# Patient Record
Sex: Male | Born: 1975
Health system: Southern US, Community
[De-identification: ages and names within clinical notes are randomized; demographics above are authoritative.]

## PROBLEM LIST (undated history)

## (undated) DIAGNOSIS — Z789 Other specified health status: Secondary | ICD-10-CM

## (undated) DIAGNOSIS — K219 Gastro-esophageal reflux disease without esophagitis: Secondary | ICD-10-CM

## (undated) DIAGNOSIS — Z8489 Family history of other specified conditions: Secondary | ICD-10-CM

---

## 2019-10-16 ENCOUNTER — Emergency Department (HOSPITAL_COMMUNITY): Payer: BC Managed Care – PPO

## 2019-10-16 ENCOUNTER — Inpatient Hospital Stay (HOSPITAL_COMMUNITY): Payer: BC Managed Care – PPO

## 2019-10-16 ENCOUNTER — Observation Stay (HOSPITAL_COMMUNITY): Payer: BC Managed Care – PPO | Admitting: Anesthesiology

## 2019-10-16 ENCOUNTER — Inpatient Hospital Stay (HOSPITAL_COMMUNITY)
Admission: EM | Admit: 2019-10-16 | Discharge: 2019-10-19 | DRG: 493 | Disposition: A | Discharge status: Home / Self Care | Payer: BC Managed Care – PPO | Attending: Student | Admitting: Student

## 2019-10-16 ENCOUNTER — Encounter (HOSPITAL_COMMUNITY)
Admission: EM | Disposition: A | Discharge status: Home / Self Care | Payer: Self-pay | Source: Home / Self Care | Attending: Student

## 2019-10-16 ENCOUNTER — Observation Stay (HOSPITAL_COMMUNITY): Payer: BC Managed Care – PPO

## 2019-10-16 ENCOUNTER — Encounter (HOSPITAL_COMMUNITY): Payer: Self-pay

## 2019-10-16 DIAGNOSIS — T148XXA Other injury of unspecified body region, initial encounter: Secondary | ICD-10-CM

## 2019-10-16 DIAGNOSIS — S93121A Dislocation of metatarsophalangeal joint of right great toe, initial encounter: Secondary | ICD-10-CM | POA: Diagnosis present

## 2019-10-16 DIAGNOSIS — S91101A Unspecified open wound of right great toe without damage to nail, initial encounter: Secondary | ICD-10-CM | POA: Insufficient documentation

## 2019-10-16 DIAGNOSIS — S92341A Displaced fracture of fourth metatarsal bone, right foot, initial encounter for closed fracture: Secondary | ICD-10-CM | POA: Diagnosis present

## 2019-10-16 DIAGNOSIS — Z8781 Personal history of (healed) traumatic fracture: Secondary | ICD-10-CM

## 2019-10-16 DIAGNOSIS — S82871A Displaced pilon fracture of right tibia, initial encounter for closed fracture: Secondary | ICD-10-CM | POA: Diagnosis present

## 2019-10-16 DIAGNOSIS — S93104A Unspecified dislocation of right toe(s), initial encounter: Secondary | ICD-10-CM | POA: Insufficient documentation

## 2019-10-16 DIAGNOSIS — F17210 Nicotine dependence, cigarettes, uncomplicated: Secondary | ICD-10-CM | POA: Diagnosis present

## 2019-10-16 DIAGNOSIS — S92911B Unspecified fracture of right toe(s), initial encounter for open fracture: Secondary | ICD-10-CM | POA: Diagnosis present

## 2019-10-16 DIAGNOSIS — E559 Vitamin D deficiency, unspecified: Secondary | ICD-10-CM | POA: Diagnosis present

## 2019-10-16 DIAGNOSIS — Y92410 Unspecified street and highway as the place of occurrence of the external cause: Secondary | ICD-10-CM | POA: Diagnosis not present

## 2019-10-16 DIAGNOSIS — S92411B Displaced fracture of proximal phalanx of right great toe, initial encounter for open fracture: Secondary | ICD-10-CM | POA: Diagnosis present

## 2019-10-16 DIAGNOSIS — Z20822 Contact with and (suspected) exposure to covid-19: Secondary | ICD-10-CM | POA: Diagnosis present

## 2019-10-16 DIAGNOSIS — S0990XA Unspecified injury of head, initial encounter: Secondary | ICD-10-CM

## 2019-10-16 DIAGNOSIS — J449 Chronic obstructive pulmonary disease, unspecified: Secondary | ICD-10-CM | POA: Diagnosis present

## 2019-10-16 DIAGNOSIS — S92301B Fracture of unspecified metatarsal bone(s), right foot, initial encounter for open fracture: Secondary | ICD-10-CM

## 2019-10-16 DIAGNOSIS — S301XXA Contusion of abdominal wall, initial encounter: Secondary | ICD-10-CM | POA: Diagnosis present

## 2019-10-16 DIAGNOSIS — S161XXA Strain of muscle, fascia and tendon at neck level, initial encounter: Secondary | ICD-10-CM

## 2019-10-16 DIAGNOSIS — S40022A Contusion of left upper arm, initial encounter: Secondary | ICD-10-CM | POA: Diagnosis present

## 2019-10-16 DIAGNOSIS — S82301A Unspecified fracture of lower end of right tibia, initial encounter for closed fracture: Secondary | ICD-10-CM

## 2019-10-16 DIAGNOSIS — S82899A Other fracture of unspecified lower leg, initial encounter for closed fracture: Secondary | ICD-10-CM | POA: Diagnosis present

## 2019-10-16 DIAGNOSIS — S2220XA Unspecified fracture of sternum, initial encounter for closed fracture: Secondary | ICD-10-CM | POA: Diagnosis present

## 2019-10-16 DIAGNOSIS — Z882 Allergy status to sulfonamides status: Secondary | ICD-10-CM | POA: Diagnosis not present

## 2019-10-16 DIAGNOSIS — S92301A Fracture of unspecified metatarsal bone(s), right foot, initial encounter for closed fracture: Secondary | ICD-10-CM

## 2019-10-16 DIAGNOSIS — S20219A Contusion of unspecified front wall of thorax, initial encounter: Secondary | ICD-10-CM

## 2019-10-16 HISTORY — PX: EXTERNAL FIXATION LEG: SHX1549

## 2019-10-16 LAB — BASIC METABOLIC PANEL
Anion gap: 12 (ref 5–15)
BUN: 8 mg/dL (ref 6–20)
CO2: 22 mmol/L (ref 22–32)
Calcium: 9.4 mg/dL (ref 8.9–10.3)
Chloride: 107 mmol/L (ref 98–111)
Creatinine, Ser: 1.05 mg/dL (ref 0.61–1.24)
GFR calc Af Amer: >60 mL/min (ref >60)
GFR calc non Af Amer: >60 mL/min (ref >60)
Glucose, Bld: 133 mg/dL — ABNORMAL HIGH (ref 70–99)
Potassium: 3.6 mmol/L (ref 3.5–5.1)
Sodium: 141 mmol/L (ref 135–145)

## 2019-10-16 LAB — SARS CORONAVIRUS 2 BY RT PCR (HOSPITAL ORDER, PERFORMED IN ~~LOC~~ HOSPITAL LAB): SARS Coronavirus 2: NEGATIVE

## 2019-10-16 LAB — HIV ANTIBODY (ROUTINE TESTING W REFLEX): HIV Screen 4th Generation wRfx: NONREACTIVE

## 2019-10-16 LAB — CBC WITH DIFFERENTIAL/PLATELET
Abs Immature Granulocytes: 0.1 10*3/uL — ABNORMAL HIGH (ref 0.00–0.07)
Basophils Absolute: 0.1 10*3/uL (ref 0.0–0.1)
Basophils Relative: 1 %
Eosinophils Absolute: 0.4 10*3/uL (ref 0.0–0.5)
Eosinophils Relative: 3 %
HCT: 48 % (ref 39.0–52.0)
Hemoglobin: 15.5 g/dL (ref 13.0–17.0)
Immature Granulocytes: 1 %
Lymphocytes Relative: 32 %
Lymphs Abs: 4.6 10*3/uL — ABNORMAL HIGH (ref 0.7–4.0)
MCH: 29.9 pg (ref 26.0–34.0)
MCHC: 32.3 g/dL (ref 30.0–36.0)
MCV: 92.5 fL (ref 80.0–100.0)
Monocytes Absolute: 0.8 10*3/uL (ref 0.1–1.0)
Monocytes Relative: 5 %
Neutro Abs: 8.7 10*3/uL — ABNORMAL HIGH (ref 1.7–7.7)
Neutrophils Relative %: 58 %
Platelets: 357 10*3/uL (ref 150–400)
RBC: 5.19 MIL/uL (ref 4.22–5.81)
RDW: 14.2 % (ref 11.5–15.5)
WBC: 14.6 10*3/uL — ABNORMAL HIGH (ref 4.0–10.5)
nRBC: 0 % (ref 0.0–0.2)

## 2019-10-16 LAB — ETHANOL: Alcohol, Ethyl (B): <10 mg/dL (ref <10)

## 2019-10-16 LAB — VITAMIN D 25 HYDROXY (VIT D DEFICIENCY, FRACTURES): Vit D, 25-Hydroxy: 18.6 ng/mL — ABNORMAL LOW (ref 30–100)

## 2019-10-16 SURGERY — EXTERNAL FIXATION, LOWER EXTREMITY
Anesthesia: General | Site: Leg Lower | Laterality: Right

## 2019-10-16 MED ORDER — METHOCARBAMOL 500 MG PO TABS: 500.0000 mg | ORAL_TABLET | Freq: Four times a day (QID) | ORAL | Status: DC | PRN

## 2019-10-16 MED ORDER — HYDROMORPHONE HCL 1 MG/ML IJ SOLN
0.5000 mg | INTRAMUSCULAR | Status: DC | PRN
  Administered 2019-10-16 (×2): 0.5 mg via INTRAVENOUS
  Filled 2019-10-16 (×3): qty 1

## 2019-10-16 MED ORDER — VANCOMYCIN HCL 1000 MG IV SOLR
INTRAVENOUS | Status: AC
  Filled 2019-10-16: qty 1000

## 2019-10-16 MED ORDER — 0.9 % SODIUM CHLORIDE (POUR BTL) OPTIME
TOPICAL | Status: DC | PRN
  Administered 2019-10-16: 1000 mL

## 2019-10-16 MED ORDER — PROMETHAZINE HCL 25 MG/ML IJ SOLN: 6.2500 mg | INTRAMUSCULAR | Status: DC | PRN

## 2019-10-16 MED ORDER — ENOXAPARIN SODIUM 40 MG/0.4ML ~~LOC~~ SOLN
40.0000 mg | SUBCUTANEOUS | Status: DC
  Administered 2019-10-17 – 2019-10-19 (×3): 40 mg via SUBCUTANEOUS
  Filled 2019-10-16 (×3): qty 0.4

## 2019-10-16 MED ORDER — OXYCODONE HCL 5 MG PO TABS
ORAL_TABLET | ORAL | Status: AC
  Filled 2019-10-16: qty 1

## 2019-10-16 MED ORDER — METHOCARBAMOL 500 MG PO TABS
500.0000 mg | ORAL_TABLET | Freq: Four times a day (QID) | ORAL | Status: DC | PRN
  Administered 2019-10-17 – 2019-10-19 (×4): 500 mg via ORAL
  Filled 2019-10-16 (×4): qty 1

## 2019-10-16 MED ORDER — METOCLOPRAMIDE HCL 5 MG PO TABS: 5.0000 mg | ORAL_TABLET | Freq: Three times a day (TID) | ORAL | Status: DC | PRN

## 2019-10-16 MED ORDER — ACETAMINOPHEN 325 MG PO TABS
650.0000 mg | ORAL_TABLET | Freq: Four times a day (QID) | ORAL | Status: DC
  Administered 2019-10-16 – 2019-10-19 (×12): 650 mg via ORAL
  Filled 2019-10-16 (×12): qty 2

## 2019-10-16 MED ORDER — PHENYLEPHRINE HCL (PRESSORS) 10 MG/ML IV SOLN
INTRAVENOUS | Status: DC | PRN
  Administered 2019-10-16 (×2): 80 ug via INTRAVENOUS

## 2019-10-16 MED ORDER — DEXAMETHASONE SODIUM PHOSPHATE 4 MG/ML IJ SOLN
INTRAMUSCULAR | Status: DC | PRN
  Administered 2019-10-16: 5 mg via INTRAVENOUS

## 2019-10-16 MED ORDER — LACTATED RINGERS IV SOLN: INTRAVENOUS | Status: DC

## 2019-10-16 MED ORDER — OXYCODONE HCL 5 MG PO TABS
5.0000 mg | ORAL_TABLET | Freq: Once | ORAL | Status: AC | PRN
  Administered 2019-10-16: 5 mg via ORAL

## 2019-10-16 MED ORDER — PROPOFOL 10 MG/ML IV BOLUS
INTRAVENOUS | Status: AC
  Filled 2019-10-16: qty 20

## 2019-10-16 MED ORDER — ONDANSETRON HCL 4 MG PO TABS: 4.0000 mg | ORAL_TABLET | Freq: Four times a day (QID) | ORAL | Status: DC | PRN

## 2019-10-16 MED ORDER — FENTANYL CITRATE (PF) 100 MCG/2ML IJ SOLN
INTRAMUSCULAR | Status: AC
  Filled 2019-10-16: qty 2

## 2019-10-16 MED ORDER — ORAL CARE MOUTH RINSE: 15.0000 mL | Freq: Once | OROMUCOSAL | Status: AC

## 2019-10-16 MED ORDER — METOPROLOL TARTRATE 5 MG/5ML IV SOLN
5.0000 mg | Freq: Four times a day (QID) | INTRAVENOUS | Status: DC | PRN
  Administered 2019-10-18: 5 mg via INTRAVENOUS
  Filled 2019-10-16: qty 5

## 2019-10-16 MED ORDER — ONDANSETRON HCL 4 MG/2ML IJ SOLN: 4.0000 mg | Freq: Four times a day (QID) | INTRAMUSCULAR | Status: DC | PRN

## 2019-10-16 MED ORDER — FENTANYL CITRATE (PF) 100 MCG/2ML IJ SOLN
25.0000 ug | INTRAMUSCULAR | Status: DC | PRN
  Administered 2019-10-16 (×2): 50 ug via INTRAVENOUS

## 2019-10-16 MED ORDER — CEFAZOLIN SODIUM-DEXTROSE 1-4 GM/50ML-% IV SOLN
1.0000 g | Freq: Once | INTRAVENOUS | Status: AC
  Administered 2019-10-16: 1 g via INTRAVENOUS
  Filled 2019-10-16: qty 50

## 2019-10-16 MED ORDER — LIDOCAINE HCL (CARDIAC) PF 100 MG/5ML IV SOSY
PREFILLED_SYRINGE | INTRAVENOUS | Status: DC | PRN
  Administered 2019-10-16: 100 mg via INTRAVENOUS

## 2019-10-16 MED ORDER — ONDANSETRON HCL 4 MG/2ML IJ SOLN
INTRAMUSCULAR | Status: DC | PRN
  Administered 2019-10-16: 4 mg via INTRAVENOUS

## 2019-10-16 MED ORDER — ENOXAPARIN SODIUM 40 MG/0.4ML ~~LOC~~ SOLN: 40.0000 mg | SUBCUTANEOUS | Status: DC

## 2019-10-16 MED ORDER — CEFAZOLIN SODIUM-DEXTROSE 2-4 GM/100ML-% IV SOLN
2.0000 g | Freq: Three times a day (TID) | INTRAVENOUS | Status: AC
  Administered 2019-10-16 – 2019-10-17 (×3): 2 g via INTRAVENOUS
  Filled 2019-10-16 (×3): qty 100

## 2019-10-16 MED ORDER — LACTATED RINGERS IV SOLN: INTRAVENOUS | Status: DC | PRN

## 2019-10-16 MED ORDER — PROPOFOL 10 MG/ML IV BOLUS
INTRAVENOUS | Status: DC | PRN
  Administered 2019-10-16: 200 mg via INTRAVENOUS

## 2019-10-16 MED ORDER — SODIUM CHLORIDE 0.9 % IV BOLUS
1000.0000 mL | Freq: Once | INTRAVENOUS | Status: AC
  Administered 2019-10-16: 1000 mL via INTRAVENOUS

## 2019-10-16 MED ORDER — SODIUM CHLORIDE 0.9 % IV SOLN: INTRAVENOUS | Status: DC

## 2019-10-16 MED ORDER — METHOCARBAMOL 1000 MG/10ML IJ SOLN
500.0000 mg | Freq: Four times a day (QID) | INTRAVENOUS | Status: DC | PRN
  Filled 2019-10-16: qty 5

## 2019-10-16 MED ORDER — IOHEXOL 300 MG/ML  SOLN
100.0000 mL | Freq: Once | INTRAMUSCULAR | Status: AC | PRN
  Administered 2019-10-16: 100 mL via INTRAVENOUS

## 2019-10-16 MED ORDER — ONDANSETRON HCL 4 MG/2ML IJ SOLN
INTRAMUSCULAR | Status: AC
  Administered 2019-10-16: 4 mg via INTRAVENOUS
  Filled 2019-10-16: qty 2

## 2019-10-16 MED ORDER — BISACODYL 10 MG RE SUPP: 10.0000 mg | Freq: Every day | RECTAL | Status: DC | PRN

## 2019-10-16 MED ORDER — DOCUSATE SODIUM 100 MG PO CAPS: 100.0000 mg | ORAL_CAPSULE | Freq: Two times a day (BID) | ORAL | Status: DC

## 2019-10-16 MED ORDER — METOCLOPRAMIDE HCL 5 MG/ML IJ SOLN: 5.0000 mg | Freq: Three times a day (TID) | INTRAMUSCULAR | Status: DC | PRN

## 2019-10-16 MED ORDER — POLYETHYLENE GLYCOL 3350 17 G PO PACK: 17.0000 g | PACK | Freq: Every day | ORAL | Status: DC | PRN

## 2019-10-16 MED ORDER — ONDANSETRON 4 MG PO TBDP: 4.0000 mg | ORAL_TABLET | Freq: Four times a day (QID) | ORAL | Status: DC | PRN

## 2019-10-16 MED ORDER — FENTANYL CITRATE (PF) 250 MCG/5ML IJ SOLN
INTRAMUSCULAR | Status: AC
  Filled 2019-10-16: qty 5

## 2019-10-16 MED ORDER — OXYCODONE HCL 5 MG/5ML PO SOLN: 5.0000 mg | Freq: Once | ORAL | Status: AC | PRN

## 2019-10-16 MED ORDER — IBUPROFEN 800 MG PO TABS
800.0000 mg | ORAL_TABLET | Freq: Three times a day (TID) | ORAL | Status: DC
  Administered 2019-10-16 – 2019-10-19 (×9): 800 mg via ORAL
  Filled 2019-10-16 (×9): qty 1

## 2019-10-16 MED ORDER — OXYCODONE HCL 5 MG PO TABS
5.0000 mg | ORAL_TABLET | ORAL | Status: DC | PRN
  Administered 2019-10-17 – 2019-10-19 (×8): 5 mg via ORAL
  Filled 2019-10-16 (×8): qty 1

## 2019-10-16 MED ORDER — FENTANYL CITRATE (PF) 100 MCG/2ML IJ SOLN
INTRAMUSCULAR | Status: DC | PRN
  Administered 2019-10-16 (×5): 50 ug via INTRAVENOUS

## 2019-10-16 MED ORDER — CHLORHEXIDINE GLUCONATE 0.12 % MT SOLN
OROMUCOSAL | Status: AC
  Administered 2019-10-16: 15 mL via OROMUCOSAL
  Filled 2019-10-16: qty 15

## 2019-10-16 MED ORDER — MIDAZOLAM HCL 5 MG/5ML IJ SOLN
INTRAMUSCULAR | Status: DC | PRN
  Administered 2019-10-16: 2 mg via INTRAVENOUS

## 2019-10-16 MED ORDER — DOCUSATE SODIUM 100 MG PO CAPS
100.0000 mg | ORAL_CAPSULE | Freq: Two times a day (BID) | ORAL | Status: DC
  Administered 2019-10-16 – 2019-10-19 (×6): 100 mg via ORAL
  Filled 2019-10-16 (×6): qty 1

## 2019-10-16 MED ORDER — MORPHINE SULFATE (PF) 4 MG/ML IV SOLN
4.0000 mg | Freq: Once | INTRAVENOUS | Status: AC
  Administered 2019-10-16: 4 mg via INTRAVENOUS
  Filled 2019-10-16: qty 1

## 2019-10-16 MED ORDER — CHLORHEXIDINE GLUCONATE 0.12 % MT SOLN: 15.0000 mL | Freq: Once | OROMUCOSAL | Status: AC

## 2019-10-16 MED ORDER — CEFAZOLIN SODIUM-DEXTROSE 2-3 GM-%(50ML) IV SOLR
INTRAVENOUS | Status: DC | PRN
Start: 2019-10-16 — End: 2019-10-16
  Administered 2019-10-16: 2 g via INTRAVENOUS

## 2019-10-16 MED ORDER — HYDRALAZINE HCL 20 MG/ML IJ SOLN: 10.0000 mg | INTRAMUSCULAR | Status: DC | PRN

## 2019-10-16 MED ORDER — VANCOMYCIN HCL 1000 MG IV SOLR
INTRAVENOUS | Status: DC | PRN
  Administered 2019-10-16: 1 g

## 2019-10-16 MED ORDER — MIDAZOLAM HCL 2 MG/2ML IJ SOLN
INTRAMUSCULAR | Status: AC
  Filled 2019-10-16: qty 2

## 2019-10-16 MED ORDER — GABAPENTIN 300 MG PO CAPS
300.0000 mg | ORAL_CAPSULE | Freq: Three times a day (TID) | ORAL | Status: DC
  Administered 2019-10-16 – 2019-10-19 (×9): 300 mg via ORAL
  Filled 2019-10-16 (×9): qty 1

## 2019-10-16 SURGICAL SUPPLY — 49 items
APPLICATOR CHLORAPREP 3ML ORNG (MISCELLANEOUS) ×2 IMPLANT
BAR GLASS FIBER EXFX 11X150 (EXFIX) ×4 IMPLANT
BAR GLASS FIBER EXFX 11X200 (EXFIX) ×2 IMPLANT
BAR GLASS FIBER EXFX 11X400 (EXFIX) ×2 IMPLANT
BAR GLASS FIBER EXFX 11X500 (EXFIX) ×2 IMPLANT
BIT DRILL 3.2 XTRAFIX BLUE (BIT) ×2 IMPLANT
BIT DRILL CANN MED FLUTE 4.0 (BIT) ×1 IMPLANT
BNDG COHESIVE 6X5 TAN STRL LF (GAUZE/BANDAGES/DRESSINGS) ×2 IMPLANT
BNDG ELASTIC 3X5.8 VLCR STR LF (GAUZE/BANDAGES/DRESSINGS) ×2 IMPLANT
BNDG GAUZE ELAST 4 BULKY (GAUZE/BANDAGES/DRESSINGS) ×4 IMPLANT
CHLORAPREP W/TINT 26 (MISCELLANEOUS) ×4 IMPLANT
CLAMP BLUE BAR TO BAR (EXFIX) ×8 IMPLANT
CLAMP BLUE BAR TO PIN (EXFIX) ×10 IMPLANT
COVER SURGICAL LIGHT HANDLE (MISCELLANEOUS) ×2 IMPLANT
DRAPE C-ARM 42X72 X-RAY (DRAPES) ×2 IMPLANT
DRAPE INCISE IOBAN 66X45 STRL (DRAPES) ×2 IMPLANT
DRAPE U-SHAPE 47X51 STRL (DRAPES) ×2 IMPLANT
DRILL CANN 4.0MM (BIT) ×2
DRSG MEPITEL 4X7.2 (GAUZE/BANDAGES/DRESSINGS) ×2 IMPLANT
ELECT REM PT RETURN 9FT ADLT (ELECTROSURGICAL) ×2
ELECTRODE REM PT RTRN 9FT ADLT (ELECTROSURGICAL) ×1 IMPLANT
GAUZE SPONGE 4X4 12PLY STRL (GAUZE/BANDAGES/DRESSINGS) ×2 IMPLANT
GAUZE XEROFORM 5X9 LF (GAUZE/BANDAGES/DRESSINGS) IMPLANT
GLOVE BIO SURGEON STRL SZ7.5 (GLOVE) ×4 IMPLANT
GOWN STRL REUS W/ TWL LRG LVL3 (GOWN DISPOSABLE) ×1 IMPLANT
GOWN STRL REUS W/ TWL XL LVL3 (GOWN DISPOSABLE) ×1 IMPLANT
GOWN STRL REUS W/TWL LRG LVL3 (GOWN DISPOSABLE) ×1
GOWN STRL REUS W/TWL XL LVL3 (GOWN DISPOSABLE) ×1
KIT BASIN OR (CUSTOM PROCEDURE TRAY) ×2 IMPLANT
KIT TURNOVER KIT B (KITS) ×2 IMPLANT
NS IRRIG 1000ML POUR BTL (IV SOLUTION) ×2 IMPLANT
PACK ORTHO EXTREMITY (CUSTOM PROCEDURE TRAY) ×2 IMPLANT
PAD ARMBOARD 7.5X6 YLW CONV (MISCELLANEOUS) ×4 IMPLANT
PAD CAST 3X4 CTTN HI CHSV (CAST SUPPLIES) ×1 IMPLANT
PADDING CAST COTTON 3X4 STRL (CAST SUPPLIES) ×1
PADDING CAST COTTON 6X4 STRL (CAST SUPPLIES) IMPLANT
PIN 4X100X20MM EXFIX LG BLUNT (EXFIX) ×4 IMPLANT
PIN BLUNT XTRFIX LG 5X160X55MM (EXFIX) ×6 IMPLANT
PIN CAPS ORTHO GREEN .062 (PIN) ×2 IMPLANT
PIN CLAMP 2BAR 75MM BLUE (EXFIX) ×2 IMPLANT
PIN TRANSFIXING 5.0 (EXFIX) ×2 IMPLANT
SPONGE LAP 18X18 RF (DISPOSABLE) IMPLANT
SUT ETHILON 2 0 FS 18 (SUTURE) IMPLANT
SUT ETHILON 3 0 FSL (SUTURE) ×4 IMPLANT
SUT VIC AB 2-0 CT1 27 (SUTURE)
SUT VIC AB 2-0 CT1 TAPERPNT 27 (SUTURE) IMPLANT
TOWEL GREEN STERILE (TOWEL DISPOSABLE) ×2 IMPLANT
UNDERPAD 30X36 HEAVY ABSORB (UNDERPADS AND DIAPERS) ×2 IMPLANT
WATER STERILE IRR 1000ML POUR (IV SOLUTION) ×2 IMPLANT
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
## 2019-10-16 NOTE — Progress Notes (Signed)
Orthopedic Tech Progress Note Patient Details:  Troy Lee 05-09-1975 [id number] Patient complained that short leg splint was too tight, I tried to loosen it and some the bandaging applied to his foot but it did not relive the discomfort. I left the splint on with loose ace wrap. Patient ID: [name], male   DOB: Nov 04, 1975, 44 y.o.   MRN: [id number]   Gerald Stabs 10/16/2019, 8:34 AM

## 2019-10-16 NOTE — Anesthesia Postprocedure Evaluation (Signed)
Anesthesia Post Note  Patient: [name]  Procedure(s) Performed: EXTERNAL FIXATION ANKLE, PINNING OF RIGHT GREAT TOE (Right Leg Lower)     Patient location during evaluation: PACU Anesthesia Type: General Level of consciousness: awake and alert Pain management: pain level controlled Vital Signs Assessment: post-procedure vital signs reviewed and stable Respiratory status: spontaneous breathing, nonlabored ventilation and respiratory function stable Cardiovascular status: blood pressure returned to baseline and stable Postop Assessment: no apparent nausea or vomiting Anesthetic complications: no   No complications documented.  Last Vitals:  Vitals:   10/16/19 1440 10/16/19 1455  BP: (!) 176/96 (!) 169/104  Pulse: 81 91  Resp: 13 16  Temp:    SpO2: 97% 99%    Last Pain:  Vitals:   10/16/19 1440  TempSrc:   PainSc: Asleep                 Beryle Lathe
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
## 2019-10-16 NOTE — Progress Notes (Signed)
Orthopedic Tech Progress Note Patient Details:  Troy Lee 05/07/1975 [id number]  Patient ID: Troy Lee, male   DOB: 02-27-1976, 44 y.o.   MRN: [id number] Applied [facility]  Trinna Post 10/16/2019, 9:32 PM

## 2019-10-16 NOTE — ED Notes (Signed)
RN into patient room and finds pt's RLE without split. Pt sts he has taken the split off because it was too painful.
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
## 2019-10-16 NOTE — Transfer of Care (Signed)
Immediate Anesthesia Transfer of Care Note  Patient: Troy Lee  Procedure(s) Performed: EXTERNAL FIXATION ANKLE (Right Leg Lower)  Patient Location: PACU  Anesthesia Type:General  Level of Consciousness: awake, alert  and oriented  Airway & Oxygen Therapy: Patient Spontanous Breathing and Patient connected to face mask oxygen  Post-op Assessment: Report given to RN and Post -op Vital signs reviewed and stable  Post vital signs: Reviewed and stable  Last Vitals:  Vitals Value Taken Time  BP 184/96 10/16/19 1410  Temp    Pulse 96 10/16/19 1412  Resp 22 10/16/19 1412  SpO2 100 % 10/16/19 1412  Vitals shown include unvalidated device data.  Last Pain:  Vitals:   10/16/19 1020  TempSrc:   PainSc: Asleep         Complications: No complications documented.

## 2019-10-16 NOTE — ED Notes (Signed)
Ortho tech at bedside to assess splint, as patient complains of it being too tight and painful. Ortho tech reports she loosened it for comfort. Awaiting pain medication orders from MD.

## 2019-10-16 NOTE — Progress Notes (Signed)
Orthopedic Tech Progress Note Patient Details:  Troy Lee 21-Nov-1975 [id number]  Ortho Devices Type of Ortho Device: Post (short leg) splint Ortho Device/Splint Location: Lower right extremity Ortho Device/Splint Interventions: Ordered, Application   Post Interventions Patient Tolerated: Well Instructions Provided: Adjustment of device, Care of device, Poper ambulation with device   [name] P Harle Stanford 10/16/2019, 7:21 AM
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
## 2019-10-16 NOTE — Anesthesia Preprocedure Evaluation (Addendum)
Anesthesia Evaluation  Patient identified by MRN, date of birth, ID band Patient awake    Reviewed: Allergy & Precautions, NPO status , Patient's Chart, lab work & pertinent test results  History of Anesthesia Complications Negative for: history of anesthetic complications  Airway Mallampati: II  TM Distance: >3 FB Neck ROM: Full    Dental  (+) Dental Advisory Given, Chipped   Pulmonary COPD (on xray), Current Smoker and Patient abstained from smoking.,    Pulmonary exam normal        Cardiovascular negative cardio ROS Normal cardiovascular exam     Neuro/Psych negative neurological ROS  negative psych ROS   GI/Hepatic negative GI ROS, Neg liver ROS,   Endo/Other  negative endocrine ROS  Renal/GU negative Renal ROS     Musculoskeletal  Sternal fracture    Abdominal   Peds  Hematology negative hematology ROS (+)   Anesthesia Other Findings Covid test negative   Reproductive/Obstetrics                            Anesthesia Physical Anesthesia Plan  ASA: II  Anesthesia Plan: General   Post-op Pain Management:    Induction: Intravenous  PONV Risk Score and Plan: 3 and Treatment may vary due to age or medical condition, Ondansetron, Midazolam and Dexamethasone  Airway Management Planned: LMA  Additional Equipment: None  Intra-op Plan:   Post-operative Plan: Extubation in OR  Informed Consent: I have reviewed the patients History and Physical, chart, labs and discussed the procedure including the risks, benefits and alternatives for the proposed anesthesia with the patient or authorized representative who has indicated his/her understanding and acceptance.     Dental advisory given  Plan Discussed with: CRNA and Anesthesiologist  Anesthesia Plan Comments:        Anesthesia Quick Evaluation
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
## 2019-10-16 NOTE — Anesthesia Procedure Notes (Signed)
Procedure Name: LMA Insertion Date/Time: 10/16/2019 11:57 AM Performed by: Tillman Abide, CRNA Pre-anesthesia Checklist: Patient identified, Emergency Drugs available, Suction available and Patient being monitored Patient Re-evaluated:Patient Re-evaluated prior to induction Oxygen Delivery Method: Circle System Utilized Preoxygenation: Pre-oxygenation with 100% oxygen Induction Type: IV induction Ventilation: Mask ventilation without difficulty LMA: LMA inserted LMA Size: 5.0 Number of attempts: 1 Airway Equipment and Method: Bite block Placement Confirmation: positive ETCO2 Tube secured with: Tape Dental Injury: Teeth and Oropharynx as per pre-operative assessment

## 2019-10-16 NOTE — Progress Notes (Signed)
Troy Lee is a 44 y.o. male patient admitted. Awake, alert - oriented  X 4 - no acute distress noted.  VSS - Blood pressure (!) 171/105, pulse 88, temperature 99 F (37.2 C), resp. rate 17, height 5\' 10"  (1.778 m), weight 83 kg, SpO2 97 %.    IV in place, occlusive dsg intact without redness. Orientation to room, and floor completed.  Admission INP armband ID verified with patient/family, and in place.   SR up x 2, fall assessment complete, with patient and family able to verbalize understanding of risk associated with falls, and verbalized understanding to call nsg before up out of bed.  Call light within reach, patient able to voice, and demonstrate understanding. No evidence of skin break down noted on exam.  Admission nurse notified of admission.     Will cont to eval and treat per MD orders.  , RN 10/16/2019 9:45 PM

## 2019-10-17 ENCOUNTER — Encounter (HOSPITAL_COMMUNITY): Payer: Self-pay | Admitting: Student

## 2019-10-17 ENCOUNTER — Inpatient Hospital Stay (HOSPITAL_COMMUNITY): Payer: BC Managed Care – PPO

## 2019-10-17 LAB — CBC
HCT: 41.3 % (ref 39.0–52.0)
Hemoglobin: 13.6 g/dL (ref 13.0–17.0)
MCH: 30 pg (ref 26.0–34.0)
MCHC: 32.9 g/dL (ref 30.0–36.0)
MCV: 91 fL (ref 80.0–100.0)
Platelets: 287 10*3/uL (ref 150–400)
RBC: 4.54 MIL/uL (ref 4.22–5.81)
RDW: 13.9 % (ref 11.5–15.5)
WBC: 11.2 10*3/uL — ABNORMAL HIGH (ref 4.0–10.5)
nRBC: 0 % (ref 0.0–0.2)

## 2019-10-17 LAB — BASIC METABOLIC PANEL
Anion gap: 11 (ref 5–15)
BUN: 7 mg/dL (ref 6–20)
CO2: 24 mmol/L (ref 22–32)
Calcium: 8.5 mg/dL — ABNORMAL LOW (ref 8.9–10.3)
Chloride: 104 mmol/L (ref 98–111)
Creatinine, Ser: 1.03 mg/dL (ref 0.61–1.24)
GFR calc Af Amer: >60 mL/min (ref >60)
GFR calc non Af Amer: >60 mL/min (ref >60)
Glucose, Bld: 122 mg/dL — ABNORMAL HIGH (ref 70–99)
Potassium: 3.7 mmol/L (ref 3.5–5.1)
Sodium: 139 mmol/L (ref 135–145)

## 2019-10-17 MED ORDER — VITAMIN D 25 MCG (1000 UNIT) PO TABS
2000.0000 [IU] | ORAL_TABLET | Freq: Two times a day (BID) | ORAL | Status: DC
  Administered 2019-10-17 – 2019-10-19 (×5): 2000 [IU] via ORAL
  Filled 2019-10-17 (×5): qty 2
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
## 2019-10-17 NOTE — TOC CAGE-AID Note (Signed)
Transition of Care [facility]) - CAGE-AID Screening   Patient Details  Name: Troy Lee MRN: [id number] Date of Birth: 1975/08/25  [facility]) CM/SW Contact:    Emeterio Reeve, [place] Phone Number: 10/17/2019, 1:50 PM   Clinical Narrative:  CSW met with pt at bedside. CSW introduced self and explained her role at the hospital.  Pt reported 1 beer about once a week. Pt denied substance use. Pt did not need resources at this time.   CAGE-AID Screening:    Have You Ever Felt You Ought to Cut Down on Your Drinking or Drug Use?: No Have People Annoyed You By Critizing Your Drinking Or Drug Use?: No Have You Felt Bad Or Guilty About Your Drinking Or Drug Use?: No Have You Ever Had a Drink or Used Drugs First Thing In The Morning to STeady Your Nerves or to Get Rid of a Hangover?: No CAGE-AID Score: 0  Substance Abuse Education Offered: Yes  Substance abuse interventions: Patient Counseling  Emeterio Reeve, Latanya Presser, [place] Social Worker [phone]
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
## 2019-10-18 NOTE — Plan of Care (Signed)
  Problem: Activity: Goal: Risk for activity intolerance will decrease Outcome: Progressing   Problem: Coping: Goal: Level of anxiety will decrease Outcome: Progressing   
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
## 2019-10-18 NOTE — Progress Notes (Signed)
PT Cancellation Note  Patient Details Name: Troy Lee MRN: [id number] DOB: 1976-01-01   Cancelled Treatment:    Reason Eval/Treat Not Completed: Medical issues which prohibited therapy. Pt BP 173/112 with c/o headache. RN notified. Will check back as time allows and when medically ready.    Sheral Apley 10/18/2019, 3:33 PM

## 2019-10-19 MED ORDER — ENOXAPARIN SODIUM 40 MG/0.4ML ~~LOC~~ SOLN: 40.0000 mg | SUBCUTANEOUS | 0 refills | Status: AC

## 2019-10-19 MED ORDER — OXYCODONE HCL 5 MG PO TABS: 5.0000 mg | ORAL_TABLET | ORAL | 0 refills | Status: DC | PRN

## 2019-10-19 MED ORDER — GABAPENTIN 300 MG PO CAPS: 300.0000 mg | ORAL_CAPSULE | Freq: Three times a day (TID) | ORAL | 0 refills | Status: AC

## 2019-10-19 MED ORDER — METHOCARBAMOL 500 MG PO TABS: 500.0000 mg | ORAL_TABLET | Freq: Four times a day (QID) | ORAL | 0 refills | Status: DC | PRN

## 2019-10-19 MED ORDER — VITAMIN D3 25 MCG PO TABS: 2000.0000 [IU] | ORAL_TABLET | Freq: Every day | ORAL | 1 refills | Status: AC

## 2019-10-19 MED FILL — oxyCODONE HCL 5 MG TABS: 5 | 7 days supply | Qty: 40 | Fill #0

## 2019-10-19 MED FILL — METHOCARBAMOL 500 MG TABS: 500 | 7 days supply | Qty: 28 | Fill #0

## 2019-10-19 MED FILL — GABAPENTIN 300 MG CAPSULE: 300 | 7 days supply | Qty: 21 | Fill #0

## 2019-10-19 NOTE — Plan of Care (Signed)
  Problem: Activity: Goal: Risk for activity intolerance will decrease Outcome: Progressing   Problem: Coping: Goal: Level of anxiety will decrease Outcome: Progressing   Problem: Pain Managment: Goal: General experience of comfort will improve Outcome: Progressing   

## 2019-10-19 NOTE — Discharge Instructions (Signed)
Orthopaedic Trauma Service Discharge Instructions   General Discharge Instructions  WEIGHT BEARING STATUS: Non-weightbearing right lower extremity  RANGE OF MOTION/ACTIVITY: Keep ex-fix in place  Wound Care: Keep pin sites clean, dry, intact. Incisions can be left open to air if there is no drainage. If incision continues to have drainage, follow wound care instructions below. Okay to shower if no drainage from incisions.  DVT/PE prophylaxis: Lovenox  Diet: as you were eating previously.  Can use over the counter stool softeners and bowel preparations, such as Miralax, to help with bowel movements.  Narcotics can be constipating.  Be sure to drink plenty of fluids  PAIN MEDICATION USE AND EXPECTATIONS  You have likely been given narcotic medications to help control your pain.  After a traumatic event that results in an fracture (broken bone) with or without surgery, it is ok to use narcotic pain medications to help control one's pain.  We understand that everyone responds to pain differently and each individual patient will be evaluated on a regular basis for the continued need for narcotic medications. Ideally, narcotic medication use should last no more than 6-8 weeks (coinciding with fracture healing).   As a patient it is your responsibility as well to monitor narcotic medication use and report the amount and frequency you use these medications when you come to your office visit.   We would also advise that if you are using narcotic medications, you should take a dose prior to therapy to maximize you participation.  IF YOU ARE ON NARCOTIC MEDICATIONS IT IS NOT PERMISSIBLE TO OPERATE A MOTOR VEHICLE (MOTORCYCLE/CAR/TRUCK/MOPED) OR HEAVY MACHINERY DO NOT MIX NARCOTICS WITH OTHER CNS (CENTRAL NERVOUS SYSTEM) DEPRESSANTS SUCH AS ALCOHOL   STOP SMOKING OR USING NICOTINE PRODUCTS!!!!  As discussed nicotine severely impairs your body's ability to heal surgical and traumatic wounds but also  impairs bone healing.  Wounds and bone heal by forming microscopic blood vessels (angiogenesis) and nicotine is a vasoconstrictor (essentially, shrinks blood vessels).  Therefore, if vasoconstriction occurs to these microscopic blood vessels they essentially disappear and are unable to deliver necessary nutrients to the healing tissue.  This is one modifiable factor that you can do to dramatically increase your chances of healing your injury.    (This means no smoking, no nicotine gum, patches, etc)  DO NOT USE NONSTEROIDAL ANTI-INFLAMMATORY DRUGS (NSAID'S)  Using products such as Advil (ibuprofen), Aleve (naproxen), Motrin (ibuprofen) for additional pain control during fracture healing can delay and/or prevent the healing response.  If you would like to take over the counter (OTC) medication, Tylenol (acetaminophen) is ok.  However, some narcotic medications that are given for pain control contain acetaminophen as well. Therefore, you should not exceed more than 4000 mg of tylenol in a day if you do not have liver disease.  Also note that there are may OTC medicines, such as cold medicines and allergy medicines that my contain tylenol as well.  If you have any questions about medications and/or interactions please ask your doctor/PA or your pharmacist.      ICE AND ELEVATE INJURED/OPERATIVE EXTREMITY  Using ice and elevating the injured extremity above your heart can help with swelling and pain control.  Icing in a pulsatile fashion, such as 20 minutes on and 20 minutes off, can be followed.    Do not place ice directly on skin. Make sure there is a barrier between to skin and the ice pack.    Using frozen items such as frozen peas works  well as the conform nicely to the are that needs to be iced.  USE AN ACE WRAP OR TED HOSE FOR SWELLING CONTROL  In addition to icing and elevation, Ace wraps or TED hose are used to help limit and resolve swelling.  It is recommended to use Ace wraps or TED hose until  you are informed to stop.    When using Ace Wraps start the wrapping distally (farthest away from the body) and wrap proximally (closer to the body)   Example: If you had surgery on your leg or thing and you do not have a splint on, start the ace wrap at the toes and work your way up to the thigh        If you had surgery on your upper extremity and do not have a splint on, start the ace wrap at your fingers and work your way up to the upper arm  CALL THE OFFICE WITH ANY QUESTIONS OR CONCERNS: [phone]   VISIT OUR WEBSITE FOR ADDITIONAL INFORMATION: [email or web address]      Discharge Pin Site Instructions  Dress pins daily with Kerlix roll starting on POD 2. Wrap the Kerlix so that it tamps the skin down around the pin-skin interface to prevent/limit motion of the skin relative to the pin.  (Pin-skin motion is the primary cause of pain and infection related to external fixator pin sites).  Remove any crust or coagulum that may obstruct drainage with a saline moistened gauze or soap and water.  After POD 3, if there is no discernable drainage on the pin site dressing, the interval for change can by increased to every other day.  You may shower with the fixator, cleaning all pin sites gently with soap and water.  If you have a surgical wound this needs to be completely dry and without drainage before showering.  The extremity can be lifted by the fixator to facilitate wound care and transfers.  Notify the office/Doctor if you experience increasing drainage, redness, or pain from a pin site, or if you notice purulent (thick, snot-like) drainage.  Discharge Wound Care Instructions  Do NOT apply any ointments, solutions or lotions to pin sites or surgical wounds.  These prevent needed drainage and even though solutions like hydrogen peroxide kill bacteria, they also damage cells lining the pin sites that help fight infection.  Applying lotions or ointments can keep the wounds moist and can  cause them to breakdown and open up as well. This can increase the risk for infection. When in doubt call the office.  Surgical incisions should be dressed daily.  If any drainage is noted, use one layer of adaptic, then gauze, Kerlix, and an ace wrap.  Once the incision is completely dry and without drainage, it may be left open to air out.  Showering may begin 36-48 hours later.  Cleaning gently with soap and water.  Traumatic wounds should be dressed daily as well.    One layer of adaptic, gauze, Kerlix, then ace wrap.  The adaptic can be discontinued once the draining has ceased    If you have a wet to dry dressing: wet the gauze with saline the squeeze as much saline out so the gauze is moist (not soaking wet), place moistened gauze over wound, then place a dry gauze over the moist one, followed by Kerlix wrap, then ace wrap.
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
## 2019-10-27 ENCOUNTER — Other Ambulatory Visit: Payer: Self-pay | Admitting: Student

## 2019-10-27 DIAGNOSIS — S82871A Displaced pilon fracture of right tibia, initial encounter for closed fracture: Secondary | ICD-10-CM

## 2019-10-31 ENCOUNTER — Encounter (HOSPITAL_COMMUNITY): Payer: Self-pay | Admitting: Student

## 2019-10-31 NOTE — Progress Notes (Signed)
Denies chest pain, shortness of breath, or cardiology visit. Educated on Museum/gallery conservator. Initially scheduled COVID test for tomorrow. Patient reports he is coming from [place]. I advised him we would perform COVID test DOS due to distance.

## 2019-11-01 ENCOUNTER — Other Ambulatory Visit (HOSPITAL_COMMUNITY): Payer: Self-pay

## 2019-11-02 ENCOUNTER — Encounter (HOSPITAL_COMMUNITY)
Admission: AD | Disposition: A | Discharge status: Home / Self Care | Payer: Self-pay | Source: Home / Self Care | Attending: Student

## 2019-11-02 ENCOUNTER — Other Ambulatory Visit: Payer: Self-pay

## 2019-11-02 ENCOUNTER — Inpatient Hospital Stay (HOSPITAL_COMMUNITY): Payer: BC Managed Care – PPO | Admitting: Anesthesiology

## 2019-11-02 ENCOUNTER — Observation Stay (HOSPITAL_COMMUNITY)
Admission: AD | Admit: 2019-11-02 | Discharge: 2019-11-03 | DRG: 494 | Disposition: A | Discharge status: Home / Self Care | Payer: BC Managed Care – PPO | Attending: Student | Admitting: Student

## 2019-11-02 ENCOUNTER — Inpatient Hospital Stay (HOSPITAL_COMMUNITY): Payer: BC Managed Care – PPO

## 2019-11-02 ENCOUNTER — Encounter (HOSPITAL_COMMUNITY): Payer: Self-pay | Admitting: Student

## 2019-11-02 DIAGNOSIS — S93121A Dislocation of metatarsophalangeal joint of right great toe, initial encounter: Secondary | ICD-10-CM | POA: Insufficient documentation

## 2019-11-02 DIAGNOSIS — Y939 Activity, unspecified: Secondary | ICD-10-CM | POA: Insufficient documentation

## 2019-11-02 DIAGNOSIS — S82871B Displaced pilon fracture of right tibia, initial encounter for open fracture type I or II: Principal | ICD-10-CM | POA: Diagnosis present

## 2019-11-02 DIAGNOSIS — Z20822 Contact with and (suspected) exposure to covid-19: Secondary | ICD-10-CM | POA: Diagnosis present

## 2019-11-02 DIAGNOSIS — Z419 Encounter for procedure for purposes other than remedying health state, unspecified: Secondary | ICD-10-CM

## 2019-11-02 DIAGNOSIS — S93104A Unspecified dislocation of right toe(s), initial encounter: Secondary | ICD-10-CM | POA: Diagnosis present

## 2019-11-02 DIAGNOSIS — S99911A Unspecified injury of right ankle, initial encounter: Secondary | ICD-10-CM | POA: Diagnosis present

## 2019-11-02 DIAGNOSIS — S93129A Dislocation of metatarsophalangeal joint of unspecified toe(s), initial encounter: Secondary | ICD-10-CM | POA: Diagnosis present

## 2019-11-02 DIAGNOSIS — T148XXA Other injury of unspecified body region, initial encounter: Secondary | ICD-10-CM

## 2019-11-02 DIAGNOSIS — Y999 Unspecified external cause status: Secondary | ICD-10-CM | POA: Insufficient documentation

## 2019-11-02 DIAGNOSIS — S82871A Displaced pilon fracture of right tibia, initial encounter for closed fracture: Principal | ICD-10-CM

## 2019-11-02 DIAGNOSIS — S92301A Fracture of unspecified metatarsal bone(s), right foot, initial encounter for closed fracture: Secondary | ICD-10-CM | POA: Diagnosis present

## 2019-11-02 DIAGNOSIS — S92411A Displaced fracture of proximal phalanx of right great toe, initial encounter for closed fracture: Secondary | ICD-10-CM | POA: Insufficient documentation

## 2019-11-02 DIAGNOSIS — K219 Gastro-esophageal reflux disease without esophagitis: Secondary | ICD-10-CM | POA: Diagnosis present

## 2019-11-02 DIAGNOSIS — F17210 Nicotine dependence, cigarettes, uncomplicated: Secondary | ICD-10-CM | POA: Diagnosis present

## 2019-11-02 DIAGNOSIS — S92411B Displaced fracture of proximal phalanx of right great toe, initial encounter for open fracture: Secondary | ICD-10-CM | POA: Diagnosis present

## 2019-11-02 DIAGNOSIS — Y92410 Unspecified street and highway as the place of occurrence of the external cause: Secondary | ICD-10-CM | POA: Diagnosis not present

## 2019-11-02 HISTORY — PX: ORIF TIBIA FRACTURE: SHX5416

## 2019-11-02 HISTORY — PX: ORIF METATARSAL FRACTURE: SUR942

## 2019-11-02 HISTORY — PX: ARTHRODESIS METATARSALPHALANGEAL JOINT (MTPJ): SHX6566

## 2019-11-02 HISTORY — DX: Gastro-esophageal reflux disease without esophagitis: K21.9

## 2019-11-02 HISTORY — DX: Family history of other specified conditions: Z84.89

## 2019-11-02 HISTORY — DX: Other specified health status: Z78.9

## 2019-11-02 LAB — COMPREHENSIVE METABOLIC PANEL
ALT: 104 U/L — ABNORMAL HIGH (ref 0–44)
AST: 67 U/L — ABNORMAL HIGH (ref 15–41)
Albumin: 4 g/dL (ref 3.5–5.0)
Alkaline Phosphatase: 131 U/L — ABNORMAL HIGH (ref 38–126)
Anion gap: 11 (ref 5–15)
BUN: 10 mg/dL (ref 6–20)
CO2: 25 mmol/L (ref 22–32)
Calcium: 9.6 mg/dL (ref 8.9–10.3)
Chloride: 105 mmol/L (ref 98–111)
Creatinine, Ser: 0.98 mg/dL (ref 0.61–1.24)
GFR calc Af Amer: >60 mL/min (ref >60)
GFR calc non Af Amer: >60 mL/min (ref >60)
Glucose, Bld: 97 mg/dL (ref 70–99)
Potassium: 4 mmol/L (ref 3.5–5.1)
Sodium: 141 mmol/L (ref 135–145)
Total Bilirubin: 0.6 mg/dL (ref 0.3–1.2)
Total Protein: 7.2 g/dL (ref 6.5–8.1)

## 2019-11-02 LAB — CBC
HCT: 45.1 % (ref 39.0–52.0)
Hemoglobin: 14.8 g/dL (ref 13.0–17.0)
MCH: 29.8 pg (ref 26.0–34.0)
MCHC: 32.8 g/dL (ref 30.0–36.0)
MCV: 90.9 fL (ref 80.0–100.0)
Platelets: 435 10*3/uL — ABNORMAL HIGH (ref 150–400)
RBC: 4.96 MIL/uL (ref 4.22–5.81)
RDW: 13.8 % (ref 11.5–15.5)
WBC: 8.2 10*3/uL (ref 4.0–10.5)
nRBC: 0 % (ref 0.0–0.2)

## 2019-11-02 LAB — SURGICAL PCR SCREEN
MRSA, PCR: NEGATIVE
Staphylococcus aureus: NEGATIVE

## 2019-11-02 LAB — SARS CORONAVIRUS 2 BY RT PCR (HOSPITAL ORDER, PERFORMED IN ~~LOC~~ HOSPITAL LAB): SARS Coronavirus 2: NEGATIVE

## 2019-11-02 SURGERY — OPEN REDUCTION INTERNAL FIXATION (ORIF) TIBIA FRACTURE
Anesthesia: General | Site: Toe | Laterality: Right

## 2019-11-02 MED ORDER — OXYCODONE HCL 5 MG PO TABS
10.0000 mg | ORAL_TABLET | ORAL | Status: DC | PRN
  Administered 2019-11-02: 10 mg via ORAL
  Administered 2019-11-03: 15 mg via ORAL
  Administered 2019-11-03: 10 mg via ORAL
  Filled 2019-11-02: qty 3

## 2019-11-02 MED ORDER — DIPHENHYDRAMINE HCL 12.5 MG/5ML PO ELIX: 12.5000 mg | ORAL_SOLUTION | ORAL | Status: DC | PRN

## 2019-11-02 MED ORDER — CHLORHEXIDINE GLUCONATE 4 % EX LIQD: 60.0000 mL | Freq: Once | CUTANEOUS | Status: DC

## 2019-11-02 MED ORDER — ONDANSETRON HCL 4 MG PO TABS: 4.0000 mg | ORAL_TABLET | Freq: Four times a day (QID) | ORAL | Status: DC | PRN

## 2019-11-02 MED ORDER — BUPIVACAINE-EPINEPHRINE (PF) 0.5% -1:200000 IJ SOLN
INTRAMUSCULAR | Status: DC | PRN
  Administered 2019-11-02 (×2): 10 mL

## 2019-11-02 MED ORDER — MIDAZOLAM HCL 2 MG/2ML IJ SOLN
INTRAMUSCULAR | Status: AC
  Filled 2019-11-02: qty 2

## 2019-11-02 MED ORDER — HYDRALAZINE HCL 20 MG/ML IJ SOLN
20.0000 mg | Freq: Once | INTRAMUSCULAR | Status: AC
  Administered 2019-11-02: 20 mg via INTRAVENOUS

## 2019-11-02 MED ORDER — OXYCODONE HCL 5 MG PO TABS: 5.0000 mg | ORAL_TABLET | Freq: Once | ORAL | Status: DC | PRN

## 2019-11-02 MED ORDER — HYDRALAZINE HCL 20 MG/ML IJ SOLN: 10.0000 mg | Freq: Once | INTRAMUSCULAR | Status: AC

## 2019-11-02 MED ORDER — ENOXAPARIN SODIUM 40 MG/0.4ML ~~LOC~~ SOLN
40.0000 mg | SUBCUTANEOUS | Status: DC
  Administered 2019-11-03: 40 mg via SUBCUTANEOUS
  Filled 2019-11-02: qty 0.4

## 2019-11-02 MED ORDER — LACTATED RINGERS IV SOLN: INTRAVENOUS | Status: DC

## 2019-11-02 MED ORDER — MIDAZOLAM HCL 2 MG/2ML IJ SOLN
4.0000 mg | Freq: Once | INTRAMUSCULAR | Status: AC
  Administered 2019-11-02: 4 mg via INTRAVENOUS

## 2019-11-02 MED ORDER — VANCOMYCIN HCL 1000 MG IV SOLR
INTRAVENOUS | Status: AC
  Filled 2019-11-02: qty 1000

## 2019-11-02 MED ORDER — OXYCODONE HCL 5 MG PO TABS
5.0000 mg | ORAL_TABLET | ORAL | Status: DC | PRN
  Filled 2019-11-02 (×2): qty 2

## 2019-11-02 MED ORDER — OXYCODONE HCL 5 MG/5ML PO SOLN: 5.0000 mg | Freq: Once | ORAL | Status: DC | PRN

## 2019-11-02 MED ORDER — CHLORHEXIDINE GLUCONATE 0.12 % MT SOLN: 15.0000 mL | Freq: Once | OROMUCOSAL | Status: AC

## 2019-11-02 MED ORDER — FENTANYL CITRATE (PF) 100 MCG/2ML IJ SOLN
25.0000 ug | INTRAMUSCULAR | Status: DC | PRN
  Administered 2019-11-02: 50 ug via INTRAVENOUS

## 2019-11-02 MED ORDER — ONDANSETRON HCL 4 MG/2ML IJ SOLN
INTRAMUSCULAR | Status: AC
  Filled 2019-11-02: qty 2

## 2019-11-02 MED ORDER — METOPROLOL TARTRATE 5 MG/5ML IV SOLN
10.0000 mg | Freq: Once | INTRAVENOUS | Status: AC
  Administered 2019-11-02: 10 mg via INTRAVENOUS
  Filled 2019-11-02: qty 10

## 2019-11-02 MED ORDER — HYDRALAZINE HCL 20 MG/ML IJ SOLN
INTRAMUSCULAR | Status: AC
  Administered 2019-11-02: 10 mg via INTRAVENOUS
  Filled 2019-11-02: qty 1

## 2019-11-02 MED ORDER — METOCLOPRAMIDE HCL 5 MG PO TABS: 5.0000 mg | ORAL_TABLET | Freq: Three times a day (TID) | ORAL | Status: DC | PRN

## 2019-11-02 MED ORDER — FENTANYL CITRATE (PF) 100 MCG/2ML IJ SOLN
INTRAMUSCULAR | Status: AC
  Filled 2019-11-02: qty 2

## 2019-11-02 MED ORDER — HYDRALAZINE HCL 10 MG PO TABS: 10.0000 mg | ORAL_TABLET | Freq: Four times a day (QID) | ORAL | Status: DC | PRN

## 2019-11-02 MED ORDER — POVIDONE-IODINE 10 % EX SWAB: 2.0000 "application " | Freq: Once | CUTANEOUS | Status: DC

## 2019-11-02 MED ORDER — BUPIVACAINE LIPOSOME 1.3 % IJ SUSP
INTRAMUSCULAR | Status: DC | PRN
  Administered 2019-11-02 (×2): 10 mL via PERINEURAL

## 2019-11-02 MED ORDER — FENTANYL CITRATE (PF) 100 MCG/2ML IJ SOLN: 100.0000 ug | Freq: Once | INTRAMUSCULAR | Status: AC

## 2019-11-02 MED ORDER — HYDROMORPHONE HCL 1 MG/ML IJ SOLN: 0.5000 mg | INTRAMUSCULAR | Status: DC | PRN

## 2019-11-02 MED ORDER — METOCLOPRAMIDE HCL 5 MG/ML IJ SOLN: 5.0000 mg | Freq: Three times a day (TID) | INTRAMUSCULAR | Status: DC | PRN

## 2019-11-02 MED ORDER — EPHEDRINE 5 MG/ML INJ
INTRAVENOUS | Status: AC
  Filled 2019-11-02: qty 10

## 2019-11-02 MED ORDER — BUPIVACAINE HCL (PF) 0.25 % IJ SOLN
INTRAMUSCULAR | Status: DC | PRN
  Administered 2019-11-02: 30 mL

## 2019-11-02 MED ORDER — ORAL CARE MOUTH RINSE: 15.0000 mL | Freq: Once | OROMUCOSAL | Status: AC

## 2019-11-02 MED ORDER — GABAPENTIN 100 MG PO CAPS
100.0000 mg | ORAL_CAPSULE | Freq: Three times a day (TID) | ORAL | Status: DC
  Administered 2019-11-02 – 2019-11-03 (×3): 100 mg via ORAL
  Filled 2019-11-02 (×3): qty 1

## 2019-11-02 MED ORDER — BUPIVACAINE HCL (PF) 0.25 % IJ SOLN
INTRAMUSCULAR | Status: AC
  Filled 2019-11-02: qty 30

## 2019-11-02 MED ORDER — ONDANSETRON HCL 4 MG/2ML IJ SOLN: 4.0000 mg | Freq: Once | INTRAMUSCULAR | Status: DC | PRN

## 2019-11-02 MED ORDER — CEFAZOLIN SODIUM-DEXTROSE 2-4 GM/100ML-% IV SOLN
2.0000 g | INTRAVENOUS | Status: AC
  Administered 2019-11-02: 2 g via INTRAVENOUS
  Filled 2019-11-02: qty 100

## 2019-11-02 MED ORDER — TOBRAMYCIN SULFATE 1.2 G IJ SOLR
INTRAMUSCULAR | Status: DC | PRN
  Administered 2019-11-02: 1.2 g via TOPICAL

## 2019-11-02 MED ORDER — 0.9 % SODIUM CHLORIDE (POUR BTL) OPTIME
TOPICAL | Status: DC | PRN
  Administered 2019-11-02: 1000 mL

## 2019-11-02 MED ORDER — FENTANYL CITRATE (PF) 100 MCG/2ML IJ SOLN
INTRAMUSCULAR | Status: AC
  Administered 2019-11-02: 100 ug via INTRAVENOUS
  Filled 2019-11-02: qty 2

## 2019-11-02 MED ORDER — DEXAMETHASONE SODIUM PHOSPHATE 10 MG/ML IJ SOLN
INTRAMUSCULAR | Status: DC | PRN
Start: 2019-11-02 — End: 2019-11-02
  Administered 2019-11-02: 10 mg via INTRAVENOUS

## 2019-11-02 MED ORDER — VANCOMYCIN HCL 1000 MG IV SOLR
INTRAVENOUS | Status: DC | PRN
  Administered 2019-11-02 (×2): 1000 mg via TOPICAL

## 2019-11-02 MED ORDER — LIDOCAINE 2% (20 MG/ML) 5 ML SYRINGE
INTRAMUSCULAR | Status: AC
  Filled 2019-11-02: qty 5

## 2019-11-02 MED ORDER — POLYETHYLENE GLYCOL 3350 17 G PO PACK: 17.0000 g | PACK | Freq: Every day | ORAL | Status: DC | PRN

## 2019-11-02 MED ORDER — TOBRAMYCIN SULFATE 1.2 G IJ SOLR
INTRAMUSCULAR | Status: AC
  Filled 2019-11-02: qty 1.2

## 2019-11-02 MED ORDER — DEXMEDETOMIDINE HCL 200 MCG/2ML IV SOLN
INTRAVENOUS | Status: DC | PRN
  Administered 2019-11-02 (×2): 8 ug via INTRAVENOUS
  Administered 2019-11-02: 4 ug via INTRAVENOUS

## 2019-11-02 MED ORDER — CEFAZOLIN SODIUM-DEXTROSE 2-4 GM/100ML-% IV SOLN
2.0000 g | Freq: Three times a day (TID) | INTRAVENOUS | Status: AC
  Administered 2019-11-02 – 2019-11-03 (×3): 2 g via INTRAVENOUS
  Filled 2019-11-02 (×4): qty 100

## 2019-11-02 MED ORDER — VITAMIN D 25 MCG (1000 UNIT) PO TABS
2000.0000 [IU] | ORAL_TABLET | Freq: Every day | ORAL | Status: DC
  Administered 2019-11-02 – 2019-11-03 (×2): 2000 [IU] via ORAL
  Filled 2019-11-02 (×3): qty 2

## 2019-11-02 MED ORDER — PROPOFOL 10 MG/ML IV BOLUS
INTRAVENOUS | Status: DC | PRN
  Administered 2019-11-02: 200 mg via INTRAVENOUS

## 2019-11-02 MED ORDER — DEXMEDETOMIDINE (PRECEDEX) IN NS 20 MCG/5ML (4 MCG/ML) IV SYRINGE
PREFILLED_SYRINGE | INTRAVENOUS | Status: AC
  Filled 2019-11-02: qty 5

## 2019-11-02 MED ORDER — DEXAMETHASONE SODIUM PHOSPHATE 10 MG/ML IJ SOLN
INTRAMUSCULAR | Status: AC
  Filled 2019-11-02: qty 1

## 2019-11-02 MED ORDER — POTASSIUM CHLORIDE IN NACL 20-0.9 MEQ/L-% IV SOLN
INTRAVENOUS | Status: DC
  Filled 2019-11-02: qty 1000

## 2019-11-02 MED ORDER — LIDOCAINE 2% (20 MG/ML) 5 ML SYRINGE
INTRAMUSCULAR | Status: DC | PRN
  Administered 2019-11-02: 40 mg via INTRAVENOUS

## 2019-11-02 MED ORDER — FENTANYL CITRATE (PF) 250 MCG/5ML IJ SOLN
INTRAMUSCULAR | Status: AC
  Filled 2019-11-02: qty 5

## 2019-11-02 MED ORDER — MEPERIDINE HCL 25 MG/ML IJ SOLN: 6.2500 mg | INTRAMUSCULAR | Status: DC | PRN

## 2019-11-02 MED ORDER — DOCUSATE SODIUM 100 MG PO CAPS
100.0000 mg | ORAL_CAPSULE | Freq: Two times a day (BID) | ORAL | Status: DC
  Administered 2019-11-02 – 2019-11-03 (×2): 100 mg via ORAL
  Filled 2019-11-02 (×2): qty 1

## 2019-11-02 MED ORDER — POTASSIUM CHLORIDE IN NACL 20-0.9 MEQ/L-% IV SOLN
INTRAVENOUS | Status: DC
  Filled 2019-11-02 (×2): qty 1000

## 2019-11-02 MED ORDER — PANTOPRAZOLE SODIUM 40 MG PO TBEC: 40.0000 mg | DELAYED_RELEASE_TABLET | Freq: Every day | ORAL | Status: DC | PRN

## 2019-11-02 MED ORDER — ONDANSETRON HCL 4 MG/2ML IJ SOLN
INTRAMUSCULAR | Status: DC | PRN
  Administered 2019-11-02: 4 mg via INTRAVENOUS

## 2019-11-02 MED ORDER — CHLORHEXIDINE GLUCONATE 0.12 % MT SOLN
OROMUCOSAL | Status: AC
  Administered 2019-11-02: 15 mL via OROMUCOSAL
  Filled 2019-11-02: qty 15

## 2019-11-02 MED ORDER — ACETAMINOPHEN 500 MG PO TABS: 1000.0000 mg | ORAL_TABLET | Freq: Four times a day (QID) | ORAL | Status: DC

## 2019-11-02 MED ORDER — EPHEDRINE SULFATE-NACL 50-0.9 MG/10ML-% IV SOSY
PREFILLED_SYRINGE | INTRAVENOUS | Status: DC | PRN
  Administered 2019-11-02: 10 mg via INTRAVENOUS

## 2019-11-02 MED ORDER — ACETAMINOPHEN 160 MG/5ML PO SOLN: 325.0000 mg | ORAL | Status: DC | PRN

## 2019-11-02 MED ORDER — ACETAMINOPHEN 325 MG PO TABS: 325.0000 mg | ORAL_TABLET | Freq: Four times a day (QID) | ORAL | Status: DC | PRN

## 2019-11-02 MED ORDER — ONDANSETRON HCL 4 MG/2ML IJ SOLN: 4.0000 mg | Freq: Four times a day (QID) | INTRAMUSCULAR | Status: DC | PRN

## 2019-11-02 MED ORDER — ACETAMINOPHEN 325 MG PO TABS: 325.0000 mg | ORAL_TABLET | ORAL | Status: DC | PRN

## 2019-11-02 MED ORDER — METHOCARBAMOL 500 MG PO TABS
500.0000 mg | ORAL_TABLET | Freq: Four times a day (QID) | ORAL | Status: DC | PRN
  Administered 2019-11-02 – 2019-11-03 (×2): 500 mg via ORAL
  Filled 2019-11-02 (×2): qty 1

## 2019-11-02 MED ORDER — FENTANYL CITRATE (PF) 250 MCG/5ML IJ SOLN
INTRAMUSCULAR | Status: DC | PRN
  Administered 2019-11-02 (×3): 50 ug via INTRAVENOUS

## 2019-11-02 MED ORDER — HYDRALAZINE HCL 20 MG/ML IJ SOLN
INTRAMUSCULAR | Status: AC
  Filled 2019-11-02: qty 1

## 2019-11-02 MED ORDER — METHOCARBAMOL 1000 MG/10ML IJ SOLN
500.0000 mg | Freq: Four times a day (QID) | INTRAVENOUS | Status: DC | PRN
  Filled 2019-11-02: qty 5

## 2019-11-02 MED ORDER — PHENYLEPHRINE HCL-NACL 10-0.9 MG/250ML-% IV SOLN
INTRAVENOUS | Status: DC | PRN
Start: 2019-11-02 — End: 2019-11-02
  Administered 2019-11-02: 25 ug/min via INTRAVENOUS

## 2019-11-02 SURGICAL SUPPLY — 89 items
ADAPTER GUIDE FAST 1.6 (MISCELLANEOUS) ×4 IMPLANT
BANDAGE ESMARK 6X9 LF (GAUZE/BANDAGES/DRESSINGS) IMPLANT
BIT DRILL 2.5X2.75 QC CALB (BIT) ×4 IMPLANT
BIT DRILL CAL 2.5 ST W/SLV (BIT) ×4 IMPLANT
BIT DRILL CALIBRATED 2.7 (BIT) ×3 IMPLANT
BIT DRILL CALIBRATED 2.7MM (BIT) ×1
BLADE 15 SAFETY STRL DISP (BLADE) ×8 IMPLANT
BLADE CLIPPER SURG (BLADE) ×4 IMPLANT
BNDG ELASTIC 4X5.8 VLCR STR LF (GAUZE/BANDAGES/DRESSINGS) ×4 IMPLANT
BNDG ELASTIC 6X10 VLCR STRL LF (GAUZE/BANDAGES/DRESSINGS) ×4 IMPLANT
BNDG ELASTIC 6X5.8 VLCR STR LF (GAUZE/BANDAGES/DRESSINGS) ×4 IMPLANT
BNDG ESMARK 6X9 LF (GAUZE/BANDAGES/DRESSINGS)
BNDG GAUZE ELAST 4 BULKY (GAUZE/BANDAGES/DRESSINGS) ×4 IMPLANT
BRUSH SCRUB EZ PLAIN DRY (MISCELLANEOUS) ×8 IMPLANT
BUCKET CAST 5QT PAPER WAX WHT (CAST SUPPLIES) ×4 IMPLANT
CHLORAPREP W/TINT 26 (MISCELLANEOUS) ×4 IMPLANT
COVER SURGICAL LIGHT HANDLE (MISCELLANEOUS) ×4 IMPLANT
CUFF TOURN SGL QUICK 34 (TOURNIQUET CUFF) ×2
CUFF TRNQT CYL 34X4.125X (TOURNIQUET CUFF) ×2 IMPLANT
DRAPE C-ARM 42X72 X-RAY (DRAPES) ×4 IMPLANT
DRAPE C-ARMOR (DRAPES) ×4 IMPLANT
DRAPE ORTHO SPLIT 77X108 STRL (DRAPES) ×4
DRAPE SURG ORHT 6 SPLT 77X108 (DRAPES) ×4 IMPLANT
DRAPE U-SHAPE 47X51 STRL (DRAPES) ×4 IMPLANT
DRILL SLEEVE 2.7 DIST TIB (TRAUMA) ×2
DRIVER STRM ST T10 (ORTHOPEDIC DISPOSABLE SUPPLIES) ×4 IMPLANT
DRSG ADAPTIC 3X8 NADH LF (GAUZE/BANDAGES/DRESSINGS) ×4 IMPLANT
DRSG MEPILEX BORDER 4X8 (GAUZE/BANDAGES/DRESSINGS) ×4 IMPLANT
DRSG PAD ABDOMINAL 8X10 ST (GAUZE/BANDAGES/DRESSINGS) ×16 IMPLANT
ELECT REM PT RETURN 9FT ADLT (ELECTROSURGICAL) ×4
ELECTRODE REM PT RTRN 9FT ADLT (ELECTROSURGICAL) ×2 IMPLANT
GAUZE SPONGE 4X4 12PLY STRL (GAUZE/BANDAGES/DRESSINGS) ×4 IMPLANT
GLOVE BIO SURGEON STRL SZ 6.5 (GLOVE) ×9 IMPLANT
GLOVE BIO SURGEON STRL SZ7.5 (GLOVE) ×16 IMPLANT
GLOVE BIO SURGEONS STRL SZ 6.5 (GLOVE) ×3
GLOVE BIOGEL PI IND STRL 6.5 (GLOVE) ×2 IMPLANT
GLOVE BIOGEL PI IND STRL 7.5 (GLOVE) ×2 IMPLANT
GLOVE BIOGEL PI INDICATOR 6.5 (GLOVE) ×2
GLOVE BIOGEL PI INDICATOR 7.5 (GLOVE) ×2
GLOVE XGUARD RR 2 7.5 (GLOVE) ×2 IMPLANT
GLOVE XGUARD RR2 7.5 (GLOVE) ×2
GOWN STRL REUS W/ TWL LRG LVL3 (GOWN DISPOSABLE) ×4 IMPLANT
GOWN STRL REUS W/TWL LRG LVL3 (GOWN DISPOSABLE) ×4
K-WIRE ACE 1.6X6 (WIRE) ×24
KIT BASIN OR (CUSTOM PROCEDURE TRAY) ×4 IMPLANT
KIT INSTRUMENT MPJ STRATUM (KITS) ×4 IMPLANT
KIT TURNOVER KIT B (KITS) ×4 IMPLANT
KWIRE ACE 1.6X6 (WIRE) ×12 IMPLANT
MANIFOLD NEPTUNE II (INSTRUMENTS) ×4 IMPLANT
NS IRRIG 1000ML POUR BTL (IV SOLUTION) ×4 IMPLANT
PACK TOTAL JOINT (CUSTOM PROCEDURE TRAY) ×4 IMPLANT
PAD ARMBOARD 7.5X6 YLW CONV (MISCELLANEOUS) ×8 IMPLANT
PAD CAST 4YDX4 CTTN HI CHSV (CAST SUPPLIES) ×2 IMPLANT
PADDING CAST COTTON 4X4 STRL (CAST SUPPLIES) ×2
PADDING CAST COTTON 6X4 STRL (CAST SUPPLIES) ×4 IMPLANT
PLATE 12H 226 RT MED DIST TIB (Plate) ×4 IMPLANT
PLATE MPJ 1ST STRM STD 7D RT (Plate) ×4 IMPLANT
SCREW CORTICAL 3.5MM  28MM (Screw) ×4 IMPLANT
SCREW CORTICAL 3.5MM  30MM (Screw) ×2 IMPLANT
SCREW CORTICAL 3.5MM  44MM (Screw) ×4 IMPLANT
SCREW CORTICAL 3.5MM 28MM (Screw) ×4 IMPLANT
SCREW CORTICAL 3.5MM 30MM (Screw) ×2 IMPLANT
SCREW CORTICAL 3.5MM 44MM (Screw) ×4 IMPLANT
SCREW LOCK CORT STAR 3.5X10 (Screw) ×4 IMPLANT
SCREW LOCK CORT STAR 3.5X14 (Screw) ×4 IMPLANT
SCREW LOCK CORT STAR 3.5X40 (Screw) ×4 IMPLANT
SCREW LOCK CORT STAR 3.5X44 (Screw) ×4 IMPLANT
SCREW LOCK STRATUM 3.5X16 (Screw) ×12 IMPLANT
SCREW LOCK STRATUM 3.5X18 (Screw) ×4 IMPLANT
SCREW LP NL T15 3.5X26 (Screw) ×4 IMPLANT
SCREW MDS ST STRM 3.5X12 (Screw) ×4 IMPLANT
SCREW STRM LOCK 3.5X14 (Screw) ×4 IMPLANT
SCREW T15 LP CORT 3.5X36MM NS (Screw) ×4 IMPLANT
SCREW T15 MD 3.5X46MM NS (Screw) ×8 IMPLANT
SLEEVE DRILL 2.7 DIST TIB (TRAUMA) ×2 IMPLANT
SPLINT PLASTER CAST XFAST 5X30 (CAST SUPPLIES) ×2 IMPLANT
SPLINT PLASTER XFAST SET 5X30 (CAST SUPPLIES) ×2
STAPLER VISISTAT 35W (STAPLE) IMPLANT
SUCTION FRAZIER HANDLE 10FR (MISCELLANEOUS) ×2
SUCTION FRAZIER TIP 8 FR DISP (SUCTIONS) ×2
SUCTION TUBE FRAZIER 10FR DISP (MISCELLANEOUS) ×2 IMPLANT
SUCTION TUBE FRAZIER 8FR DISP (SUCTIONS) ×2 IMPLANT
SUT ETHILON 3 0 PS 1 (SUTURE) ×24 IMPLANT
SUT VIC AB 2-0 CT1 27 (SUTURE) ×10
SUT VIC AB 2-0 CT1 TAPERPNT 27 (SUTURE) ×10 IMPLANT
TOWEL GREEN STERILE (TOWEL DISPOSABLE) ×8 IMPLANT
TOWEL GREEN STERILE FF (TOWEL DISPOSABLE) ×4 IMPLANT
UNDERPAD 30X36 HEAVY ABSORB (UNDERPADS AND DIAPERS) ×4 IMPLANT
WATER STERILE IRR 1000ML POUR (IV SOLUTION) ×8 IMPLANT

## 2019-11-02 NOTE — Progress Notes (Signed)
   11/02/19 1808  Assess: MEWS Score  Temp 98.8 F (37.1 C)  BP (!) 139/93  Pulse Rate (!) 135  Resp 16  Level of Consciousness Alert  SpO2 99 %  O2 Device Room Air  Assess: MEWS Score  MEWS Temp 0  MEWS Systolic 0  MEWS Pulse 3  MEWS RR 0  MEWS LOC 0  MEWS Score 3  MEWS Score Color Yellow  Assess: if the MEWS score is Yellow or Red  Were vital signs taken at a resting state? Yes  Focused Assessment Change from prior assessment (see assessment flowsheet)  Early Detection of Sepsis Score *See Row Information* Low  MEWS guidelines implemented *See Row Information* Yes  Treat  MEWS Interventions Administered prn meds/treatments  Pain Scale 0-10  Pain Score 2  Pain Type Surgical pain  Pain Location Ankle  Pain Orientation Right  Pain Descriptors / Indicators Discomfort  Pain Frequency Intermittent  Pain Onset On-going  Pain Intervention(s) Elevated extremity  Take Vital Signs  Increase Vital Sign Frequency  Yellow: Q 2hr X 2 then Q 4hr X 2, if remains yellow, continue Q 4hrs  Escalate  MEWS: Escalate Yellow: discuss with charge nurse/RN and consider discussing with provider and RRT  Notify: Charge Nurse/RN  Name of Charge Nurse/RN Notified Jasmine December, RN  Date Charge Nurse/RN Notified 11/02/19  Time Charge Nurse/RN Notified 1810  Notify: Provider  Provider Name/Title Daun Peacock, PA  Date Provider Notified 11/02/19  Time Provider Notified [phone]  Notification Type Call  Notification Reason Other (Comment) (Sustained elevated HR and BP)  Response See new orders  Date of Provider Response 11/02/19  Time of Provider Response 1812  Notify: Rapid Response  Name of Rapid Response RN Notified n/a   Pt complaining of headache 7/10, vital signs taken. Charge RN and Daun Peacock, PA notified, new order received and carried out, Pt resting in bed, states his headache is getting a little better. Family at bedside updated.

## 2019-11-02 NOTE — Anesthesia Postprocedure Evaluation (Signed)
Anesthesia Post Note  Patient: Troy Lee  Procedure(s) Performed: OPEN REDUCTION INTERNAL FIXATION (ORIF) PILON FRACTURE (Right ) ARTHRODESIS 1ST  METATARSALPHALANGEAL JOINT (MTPJ) (Right Toe)     Patient location during evaluation: PACU Anesthesia Type: General Level of consciousness: awake and alert Pain management: pain level controlled Vital Signs Assessment: post-procedure vital signs reviewed and stable Respiratory status: spontaneous breathing, nonlabored ventilation, respiratory function stable and patient connected to nasal cannula oxygen Cardiovascular status: blood pressure returned to baseline and stable Postop Assessment: no apparent nausea or vomiting Anesthetic complications: no   No complications documented.  Last Vitals:  Vitals:   11/02/19 1558 11/02/19 1600  BP: (!) 154/103 (!) 154/103  Pulse: (!) 140 (!) 136  Resp: (!) 24 (!) 24  Temp:    SpO2: 97% 97%    Last Pain:  Vitals:   11/02/19 1545  PainSc: 4                  Beverly Ferner
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
## 2019-11-02 NOTE — Discharge Instructions (Addendum)
Truitt Merle, MD Ulyses Southward, PA-C [facility] [address] [phone])   [phone](fax)   POST-OPERATIVE INSTRUCTIONS - LOWER EXTREMITY   WOUND CARE ? Please keep splint clean dry and intact until followup.  ? You may shower on Post-Op Day #2.  ? You must keep splint dry during this process and may find that a plastic bag taped around the leg or alternatively a towel based bath may be a better option.   ? If you get your splint wet or if it is damaged please contact our clinic.  EXERCISES ? Due to your splint being in place you will not be able to bear weight through your extremity.   ? DO NOT PUT ANY WEIGHT ON YOUR OPERATIVE LEG ? Please use crutches or a walker to avoid weight bearing.   REGIONAL ANESTHESIA (NERVE BLOCKS) . The anesthesia team may have performed a nerve block for you if safe in the setting of your care.  This is a great tool used to minimize pain.  Typically the block may start wearing off overnight but the long acting medicine may last for 3-4 days.  The nerve block wearing off can be a challenging period but please utilize your as needed pain medications to try and manage this period.    POST-OP MEDICATIONS- Multimodal approach to pain control . In general your pain will be controlled with a combination of substances.  Prescriptions unless otherwise discussed are electronically sent to your pharmacy.  This is a carefully made plan we use to minimize narcotic use.     - Acetaminophen - Non-narcotic pain medicine taken on a scheduled basis   - Oxycodone - This is a strong narcotic, to be used only on an "as needed" basis for pain.  -  Aspirin 325 mg - This medicine is used to minimize the risk of blood clots after surgery.             -          Zofran - take as needed for nausea   FOLLOW-UP ? If you develop a Fever (>101.5), Redness or Drainage from the surgical incision site, please call our office to arrange for an  evaluation. ? Please call the office to schedule a follow-up appointment for your incision check if you do not already have one, 7-10 days post-operatively.  IF YOU HAVE ANY QUESTIONS, PLEASE FEEL FREE TO CALL OUR OFFICE.  HELPFUL INFORMATION  ? If you had a block, it will wear off between 8-24 hrs postop typically.  This is period when your pain may go from nearly zero to the pain you would have had postop without the block.  This is an abrupt transition but nothing dangerous is happening.  You may take an extra dose of narcotic when this happens.  ? You should wean off your narcotic medicines as soon as you are able.  Most patients will be off or using minimal narcotics before their first postop appointment.   ? We suggest you use the pain medication the first night prior to going to bed, in order to ease any pain when the anesthesia wears off. You should avoid taking pain medications on an empty stomach as it will make you nauseous.  ? Do not drink alcoholic beverages or take illicit drugs when taking pain medications.  ? In most states it is against the law to drive while you are in a splint or sling.  And certainly against the law to  drive while taking narcotics.  ? You may return to work/school in the next couple of days when you feel up to it.   ? Pain medication may make you constipated.  Below are a few solutions to try in this order: - Decrease the amount of pain medication if you aren't having pain. - Drink lots of decaffeinated fluids. - Drink prune juice and/or each dried prunes  o If the first 3 don't work start with additional solutions - Take Colace - an over-the-counter stool softener - Take Senokot - an over-the-counter laxative - Take Miralax - a stronger over-the-counter laxative

## 2019-11-02 NOTE — Plan of Care (Signed)
  Problem: Health Behavior/Discharge Planning: Goal: Ability to manage health-related needs will improve Outcome: Progressing   Problem: Clinical Measurements: Goal: Ability to maintain clinical measurements within normal limits will improve 11/02/2019 1903 by Darrow Bussing, RN Outcome: Progressing 11/02/2019 1903 by Darrow Bussing, RN Outcome: Progressing   Problem: Activity: Goal: Risk for activity intolerance will decrease Outcome: Progressing   Problem: Nutrition: Goal: Adequate nutrition will be maintained 11/02/2019 1903 by Darrow Bussing, RN Outcome: Progressing 11/02/2019 1903 by Darrow Bussing, RN Outcome: Progressing   Problem: Coping: Goal: Level of anxiety will decrease 11/02/2019 1903 by Darrow Bussing, RN Outcome: Progressing 11/02/2019 1903 by Darrow Bussing, RN Outcome: Progressing   Problem: Elimination: Goal: Will not experience complications related to bowel motility 11/02/2019 1903 by Darrow Bussing, RN Outcome: Progressing 11/02/2019 1903 by Darrow Bussing, RN Outcome: Progressing   Problem: Pain Managment: Goal: General experience of comfort will improve 11/02/2019 1903 by Darrow Bussing, RN Outcome: Progressing 11/02/2019 1903 by Darrow Bussing, RN Outcome: Progressing   Problem: Safety: Goal: Ability to remain free from injury will improve 11/02/2019 1903 by Darrow Bussing, RN Outcome: Progressing 11/02/2019 1903 by Darrow Bussing, RN Outcome: Progressing

## 2019-11-02 NOTE — Transfer of Care (Signed)
Immediate Anesthesia Transfer of Care Note  Patient: Troy Lee  Procedure(s) Performed: OPEN REDUCTION INTERNAL FIXATION (ORIF) PILON FRACTURE (Right ) ARTHRODESIS 1ST  METATARSALPHALANGEAL JOINT (MTPJ) (Right Toe)  Patient Location: PACU  Anesthesia Type:GA combined with regional for post-op pain  Level of Consciousness: awake, alert  and oriented  Airway & Oxygen Therapy: Patient Spontanous Breathing and Patient connected to nasal cannula oxygen  Post-op Assessment: Report given to RN and Post -op Vital signs reviewed and stable  Post vital signs: Reviewed and stable  Last Vitals:  Vitals Value Taken Time  BP 135/84 11/02/19 1330  Temp 37 C 11/02/19 1330  Pulse 119 11/02/19 1337  Resp 16 11/02/19 1337  SpO2 98 % 11/02/19 1337  Vitals shown include unvalidated device data.  Last Pain:  Vitals:   11/02/19 0817  PainSc: 7       Patients Stated Pain Goal: 5 (11/02/19 0730)  Complications: No complications documented.

## 2019-11-02 NOTE — Anesthesia Procedure Notes (Signed)
Procedure Name: LMA Insertion Date/Time: 11/02/2019 9:26 AM Performed by: Quentin Ore, CRNA Pre-anesthesia Checklist: Patient identified, Emergency Drugs available, Suction available and Patient being monitored Patient Re-evaluated:Patient Re-evaluated prior to induction Oxygen Delivery Method: Circle system utilized Preoxygenation: Pre-oxygenation with 100% oxygen Induction Type: IV induction Ventilation: Mask ventilation without difficulty LMA: LMA inserted LMA Size: 5.0 Number of attempts: 1 Placement Confirmation: positive ETCO2 and breath sounds checked- equal and bilateral Tube secured with: Tape Dental Injury: Teeth and Oropharynx as per pre-operative assessment

## 2019-11-02 NOTE — Anesthesia Procedure Notes (Signed)
Anesthesia Regional Block: Popliteal block   Pre-Anesthetic Checklist: ,, timeout performed, Correct Patient, Correct Site, Correct Laterality, Correct Procedure, Correct Position, site marked, Risks and benefits discussed,  Surgical consent,  Pre-op evaluation,  At surgeon's request and post-op pain management  Laterality: Right  Prep: chloraprep       Needles:  Injection technique: Single-shot  Needle Type: Echogenic Stimulator Needle     Needle Length: 5cm  Needle Gauge: 22     Additional Needles:   Procedures:, nerve stimulator,,, ultrasound used (permanent image in chart),,,,   Nerve Stimulator or Paresthesia:  Response: foot, 0.45 mA,   Additional Responses:   Narrative:  Start time: 11/02/2019 9:05 AM End time: 11/02/2019 9:10 AM Injection made incrementally with aspirations every 5 mL.  Performed by: Personally  Anesthesiologist: Heather Roberts, MD  Additional Notes: Functioning IV was confirmed and monitors were applied.  A 41mm 22ga Arrow echogenic stimulator needle was used. Sterile prep and drape,hand hygiene and sterile gloves were used. Ultrasound guidance: relevant anatomy identified, needle position confirmed, local anesthetic spread visualized around nerve(s)., vascular puncture avoided.  Image printed for medical record. Negative aspiration and negative test dose prior to incremental administration of local anesthetic. The patient tolerated the procedure well.

## 2019-11-02 NOTE — Interval H&P Note (Signed)
History and Physical Interval Note:  11/02/2019 7:04 AM  Troy Lee  has presented today for surgery, with the diagnosis of RIGHT PILON FRACTURE, RIGHT 1ST METATARSAL FRACTURE.  The various methods of treatment have been discussed with the patient and family. After consideration of risks, benefits and other options for treatment, the patient has consented to  Procedure(s): OPEN REDUCTION INTERNAL FIXATION (ORIF) PILON FRACTURE (Right) ARTHRODESIS 1ST  METATARSALPHALANGEAL JOINT (MTPJ) (Right) as a surgical intervention.  The patient's history has been reviewed, patient examined, no change in status, stable for surgery.  I have reviewed the patient's chart and labs.  Questions were answered to the patient's satisfaction.     Caryn Bee P Oluwatobi Visser

## 2019-11-02 NOTE — Anesthesia Procedure Notes (Signed)
Anesthesia Regional Block: Adductor canal block   Pre-Anesthetic Checklist: ,, timeout performed, Correct Patient, Correct Site, Correct Laterality, Correct Procedure, Correct Position, site marked, Risks and benefits discussed,  Surgical consent,  Pre-op evaluation,  At surgeon's request and post-op pain management  Laterality: Right  Prep: chloraprep       Needles:  Injection technique: Single-shot  Needle Type: Echogenic Stimulator Needle     Needle Length: 5cm  Needle Gauge: 22     Additional Needles:   Procedures:, nerve stimulator,,, ultrasound used (permanent image in chart),,,,  Narrative:  Start time: 11/02/2019 9:00 AM End time: 11/02/2019 9:03 AM Injection made incrementally with aspirations every 5 mL.  Performed by: Personally  Anesthesiologist: Bethena Midget, MD  Additional Notes: Functioning IV was confirmed and monitors were applied.  A 68mm 22ga Arrow echogenic stimulator needle was used. Sterile prep and drape,hand hygiene and sterile gloves were used. Ultrasound guidance: relevant anatomy identified, needle position confirmed, local anesthetic spread visualized around nerve(s)., vascular puncture avoided.  Image printed for medical record. Negative aspiration and negative test dose prior to incremental administration of local anesthetic. The patient tolerated the procedure well.

## 2019-11-02 NOTE — Anesthesia Preprocedure Evaluation (Addendum)
Anesthesia Evaluation  Patient identified by MRN, date of birth, ID band Patient awake    Reviewed: Allergy & Precautions, NPO status , Patient's Chart, lab work & pertinent test results  History of Anesthesia Complications Negative for: history of anesthetic complications  Airway Mallampati: I  TM Distance: >3 FB Neck ROM: Full    Dental  (+) Dental Advisory Given, Chipped   Pulmonary COPD (on xray), Current Smoker and Patient abstained from smoking.,    Pulmonary exam normal        Cardiovascular Normal cardiovascular exam     Neuro/Psych    GI/Hepatic   Endo/Other    Renal/GU      Musculoskeletal  Sternal fracture    Abdominal   Peds  Hematology   Anesthesia Other Findings Covid test negative   Reproductive/Obstetrics                           Anesthesia Physical  Anesthesia Plan  ASA: II  Anesthesia Plan: General   Post-op Pain Management:    Induction: Intravenous  PONV Risk Score and Plan: 3 and Treatment may vary due to age or medical condition, Ondansetron, Midazolam and Dexamethasone  Airway Management Planned: LMA  Additional Equipment: None  Intra-op Plan:   Post-operative Plan: Extubation in OR  Informed Consent: I have reviewed the patients History and Physical, chart, labs and discussed the procedure including the risks, benefits and alternatives for the proposed anesthesia with the patient or authorized representative who has indicated his/her understanding and acceptance.     Dental advisory given  Plan Discussed with: CRNA and Anesthesiologist  Anesthesia Plan Comments:        Anesthesia Quick Evaluation

## 2019-11-03 ENCOUNTER — Encounter (HOSPITAL_COMMUNITY): Payer: Self-pay | Admitting: Student

## 2019-11-03 DIAGNOSIS — S82871A Displaced pilon fracture of right tibia, initial encounter for closed fracture: Secondary | ICD-10-CM | POA: Diagnosis not present

## 2019-11-03 LAB — BASIC METABOLIC PANEL
Anion gap: 9 (ref 5–15)
BUN: 11 mg/dL (ref 6–20)
CO2: 26 mmol/L (ref 22–32)
Calcium: 8.8 mg/dL — ABNORMAL LOW (ref 8.9–10.3)
Chloride: 106 mmol/L (ref 98–111)
Creatinine, Ser: 1 mg/dL (ref 0.61–1.24)
GFR calc Af Amer: >60 mL/min (ref >60)
GFR calc non Af Amer: >60 mL/min (ref >60)
Glucose, Bld: 134 mg/dL — ABNORMAL HIGH (ref 70–99)
Potassium: 3.5 mmol/L (ref 3.5–5.1)
Sodium: 141 mmol/L (ref 135–145)

## 2019-11-03 LAB — CBC
HCT: 37.1 % — ABNORMAL LOW (ref 39.0–52.0)
Hemoglobin: 12.1 g/dL — ABNORMAL LOW (ref 13.0–17.0)
MCH: 29.5 pg (ref 26.0–34.0)
MCHC: 32.6 g/dL (ref 30.0–36.0)
MCV: 90.5 fL (ref 80.0–100.0)
Platelets: 394 10*3/uL (ref 150–400)
RBC: 4.1 MIL/uL — ABNORMAL LOW (ref 4.22–5.81)
RDW: 13.8 % (ref 11.5–15.5)
WBC: 11.9 10*3/uL — ABNORMAL HIGH (ref 4.0–10.5)
nRBC: 0 % (ref 0.0–0.2)

## 2019-11-03 MED ORDER — METHOCARBAMOL 500 MG PO TABS: 500.0000 mg | ORAL_TABLET | Freq: Four times a day (QID) | ORAL | 0 refills | Status: AC | PRN

## 2019-11-03 MED ORDER — ONDANSETRON HCL 4 MG PO TABS: 4.0000 mg | ORAL_TABLET | Freq: Four times a day (QID) | ORAL | 0 refills | Status: AC | PRN

## 2019-11-03 MED ORDER — OXYCODONE HCL 5 MG PO TABS: 5.0000 mg | ORAL_TABLET | ORAL | 0 refills | Status: AC | PRN

## 2019-11-03 MED FILL — METHOCARBAMOL 500 MG TABS: 500 | 8 days supply | Qty: 60 | Fill #0

## 2019-11-03 MED FILL — ONDANSETRON HCL 4 MG TABLET: 4 | 5 days supply | Qty: 20 | Fill #0

## 2019-11-03 NOTE — Discharge Summary (Addendum)
Orthopaedic Trauma Service (OTS) Discharge Summary   Patient ID: Troy Lee MRN: [id number] DOB/AGE: 1976/01/02 44 y.o.  Admit date: 11/02/2019 Discharge date: 11/03/2019  Admission Diagnoses: 1. Right closed pilon fracture Right open 1st MTP dislocation Right open 1st proximal phalanx intra-articular fracture   Discharge Diagnoses:  Principal Problem:   Closed right pilon fracture, initial encounter Active Problems:   Open dislocation of great toe of right foot   Open displaced fracture of proximal phalanx of right great toe   Closed fracture of metatarsal neck, right, initial encounter   Closed right pilon fracture   Past Medical History:  Diagnosis Date  . Family history of adverse reaction to anesthesia    Grandmother passed away after anesthesia, unsure exact cause, no other family history of anesthesia problems  . GERD (gastroesophageal reflux disease)   . Medical history non-contributory      Procedures Performed: 1. CPT 27827-Open reduction internal fixation of right pilon fracture 2. CPT 27758-Open reduction internal fixation of right tibial shaft fracture 3. CPT 20694-Removal of external fixator right ankle 4. CPT 11044-Debridement of external fixator pin sites 5. CPT 28750-Arthrodesis of right 1st metatarsal phalangeal joint  Discharged Condition: good  Hospital Course: Patient presented to [facility] on 11/02/2019 for scheduled procedure.  Was taken to the operating room by Dr. Jena Gauss for the above procedures, tolerated this well without complications.  Was placed in a short leg splint and instructed to be nonweightbearing on the right lower extremity postoperatively.  Was started on Lovenox for DVT prophylaxis beginning on postoperative day #1.  Began working physical and occupational therapy starting postoperative day #1 for gait training and overall mobility, did well with this. On 11/03/2019, the patient was tolerating diet, working well with  therapies, pain well controlled, vital signs stable, dressings clean, dry, intact and felt stable for discharge to home. Patient will follow up as below and knows to call with questions or concerns.     Consults: None  Significant Diagnostic Studies:   Results for orders placed or performed during the hospital encounter of 11/02/19 (from the past 168 hour(s))  SARS Coronavirus 2 by RT PCR (hospital order, performed in [facility] hospital lab) Nasopharyngeal Nasopharyngeal Swab   Collection Time: 11/02/19  6:31 AM   Specimen: Nasopharyngeal Swab  Result Value Ref Range   SARS Coronavirus 2 NEGATIVE NEGATIVE  CBC   Collection Time: 11/02/19  8:15 AM  Result Value Ref Range   WBC 8.2 4.0 - 10.5 K/uL   RBC 4.96 4.22 - 5.81 MIL/uL   Hemoglobin 14.8 13.0 - 17.0 g/dL   HCT 90.3 39 - 52 %   MCV 90.9 80.0 - 100.0 fL   MCH 29.8 26.0 - 34.0 pg   MCHC 32.8 30.0 - 36.0 g/dL   RDW 00.9 23.3 - 00.7 %   Platelets 435 (H) 150 - 400 K/uL   nRBC 0.0 0.0 - 0.2 %  Comprehensive metabolic panel   Collection Time: 11/02/19  8:16 AM  Result Value Ref Range   Sodium 141 135 - 145 mmol/L   Potassium 4.0 3.5 - 5.1 mmol/L   Chloride 105 98 - 111 mmol/L   CO2 25 22 - 32 mmol/L   Glucose, Bld 97 70 - 99 mg/dL   BUN 10 6 - 20 mg/dL   Creatinine, Ser 6.22 0.61 - 1.24 mg/dL   Calcium 9.6 8.9 - 63.3 mg/dL   Total Protein 7.2 6.5 - 8.1 g/dL   Albumin 4.0 3.5 -  5.0 g/dL   AST 67 (H) 15 - 41 U/L   ALT 104 (H) 0 - 44 U/L   Alkaline Phosphatase 131 (H) 38 - 126 U/L   Total Bilirubin 0.6 0.3 - 1.2 mg/dL   GFR calc non Af Amer >60 >60 mL/min   GFR calc Af Amer >60 >60 mL/min   Anion gap 11 5 - 15  Surgical pcr screen   Collection Time: 11/02/19  9:00 AM   Specimen: Nasal Mucosa; Nasal Swab  Result Value Ref Range   MRSA, PCR NEGATIVE NEGATIVE   Staphylococcus aureus NEGATIVE NEGATIVE  Basic metabolic panel   Collection Time: 11/03/19  2:22 AM  Result Value Ref Range   Sodium 141 135 - 145 mmol/L    Potassium 3.5 3.5 - 5.1 mmol/L   Chloride 106 98 - 111 mmol/L   CO2 26 22 - 32 mmol/L   Glucose, Bld 134 (H) 70 - 99 mg/dL   BUN 11 6 - 20 mg/dL   Creatinine, Ser 8.67 0.61 - 1.24 mg/dL   Calcium 8.8 (L) 8.9 - 10.3 mg/dL   GFR calc non Af Amer >60 >60 mL/min   GFR calc Af Amer >60 >60 mL/min   Anion gap 9 5 - 15  CBC   Collection Time: 11/03/19  2:22 AM  Result Value Ref Range   WBC 11.9 (H) 4.0 - 10.5 K/uL   RBC 4.10 (L) 4.22 - 5.81 MIL/uL   Hemoglobin 12.1 (L) 13.0 - 17.0 g/dL   HCT 67.2 (L) 39 - 52 %   MCV 90.5 80.0 - 100.0 fL   MCH 29.5 26.0 - 34.0 pg   MCHC 32.6 30.0 - 36.0 g/dL   RDW 09.4 70.9 - 62.8 %   Platelets 394 150 - 400 K/uL   nRBC 0.0 0.0 - 0.2 %     Treatments: IV hydration, antibiotics: Ancef, analgesia: acetaminophen, Dilaudid and oxycodone, anticoagulation: LMW heparin, therapies: PT and OT and surgery: as above  Discharge Exam: General: NAD, alert and oriented.  RLE: Splint in place, ace wrap with bloody drainage. Dressing partially taken down and reinforced.  Nontender above the splint.  Extremities warm.  Brisk cap refill.  Disposition: Discharge disposition: 01-Home or Self Care        Allergies as of 11/03/2019      Reactions   Sulfa Antibiotics    migraine      Medication List    TAKE these medications   enoxaparin 40 MG/0.4ML injection Commonly known as: LOVENOX Inject 0.4 mLs (40 mg total) into the skin daily.   gabapentin 300 MG capsule Commonly known as: NEURONTIN Take 1 capsule (300 mg total) by mouth 3 (three) times daily.   methocarbamol 500 MG tablet Commonly known as: ROBAXIN Take 1-2 tablets (500-1,000 mg total) by mouth every 6 (six) hours as needed for muscle spasms. What changed: how much to take   omeprazole 20 MG tablet Commonly known as: PRILOSEC OTC Take 20 mg by mouth daily.   ondansetron 4 MG tablet Commonly known as: ZOFRAN Take 1 tablet (4 mg total) by mouth every 6 (six) hours as needed for nausea.    oxyCODONE 5 MG immediate release tablet Commonly known as: Oxy IR/ROXICODONE Take 1-2 tablets (5-10 mg total) by mouth every 4 (four) hours as needed for severe pain.   Vitamin D3 25 MCG tablet Commonly known as: Vitamin D Take 2 tablets (2,000 Units total) by mouth daily.  Durable Medical Equipment  (From admission, onward)         Start     Ordered   11/03/19 0921  For home use only DME Crutches  Once        11/03/19 0920          Follow-up Information    Haddix, Gillie Manners, MD. Schedule an appointment as soon as possible for a visit in 2 weeks.   Specialty: Orthopedic Surgery Why: For suture removal, For wound re-check, for repeat x-rays Contact information: [address] [place] [address] [phone]               Discharge Instructions and Plan: Patient will be discharged to home.  He will be traveling back to [place].  He should remain NWB on RLE. Will be discharged on Lovenox for DVT prophylaxis. Patient has been provided with all the necessary DME for discharge. Patient will follow up with Dr. Jena Gauss in 2 weeks for repeat x-rays and suture removal.   Signed:  Shawn Route. Ladonna Snide ?[phone]? (phone) 11/03/2019, 11:06 AM  [facility] [address] [place] [address] [phone] (F)
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
## 2019-11-03 NOTE — Progress Notes (Signed)
Discharge instruction package provided.  The patient verbalizes understanding discharge instructions.  The patient is discharged to home.

## 2019-11-03 NOTE — Plan of Care (Signed)

## 2019-11-03 NOTE — Plan of Care (Signed)
  Problem: Pain Managment: Goal: General experience of comfort will improve Outcome: Progressing   Problem: Safety: Goal: Ability to remain free from injury will improve Outcome: Progressing   Problem: Skin Integrity: Goal: Risk for impaired skin integrity will decrease Outcome: Progressing   

## 2019-11-03 NOTE — Progress Notes (Signed)
Orthopedic Tech Progress Note Patient Details:  Troy Lee [birth year] [id number]  Ortho Devices Type of Ortho Device: Crutches Ortho Device/Splint Interventions: Ordered       Gerald Stabs 11/03/2019, 9:37 AM
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
## 2020-08-27 IMAGING — RF DG C-ARM 1-60 MIN
1 series · 11 of 11 positions shown · non-contrast
Comparison: October 16, 2019.

CLINICAL DATA: Open reduction and internal fixation of right ankle
fracture and right great toe.

EXAM:
RIGHT ANKLE - COMPLETE 3+ VIEW; DG C-ARM 1-60 MIN; RIGHT GREAT TOE
Radiation exposure index: 7.15 mGy.

[Series 1: run · 11 of 11 slices shown]
[im 1/11]
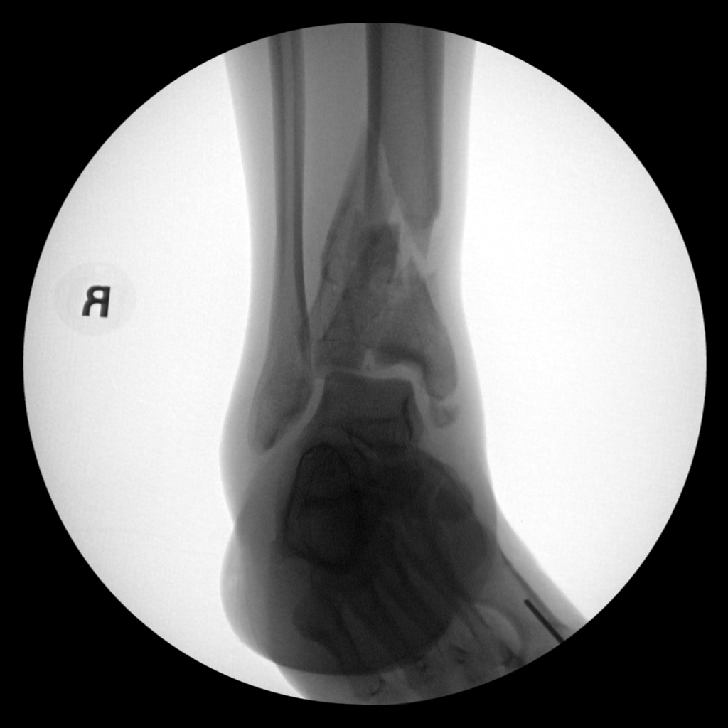
[im 2/11]
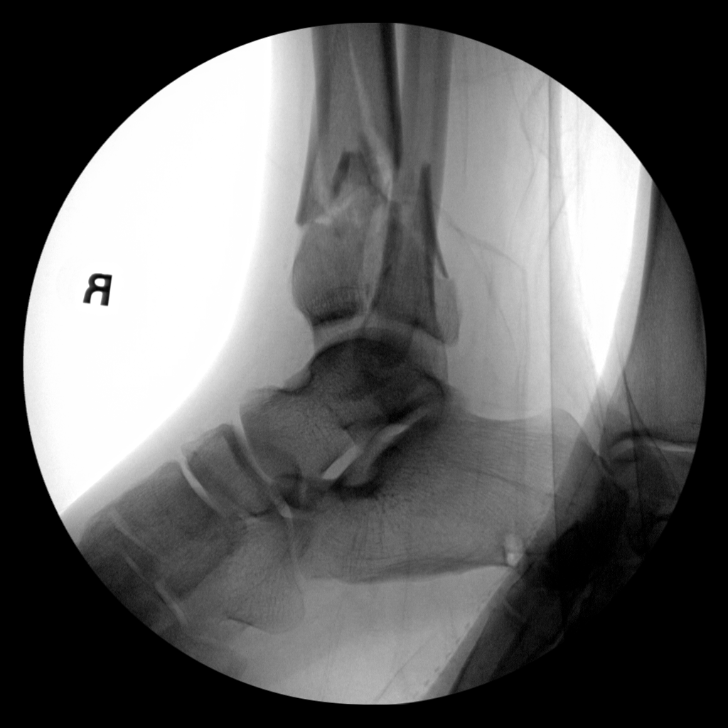
[im 3/11]
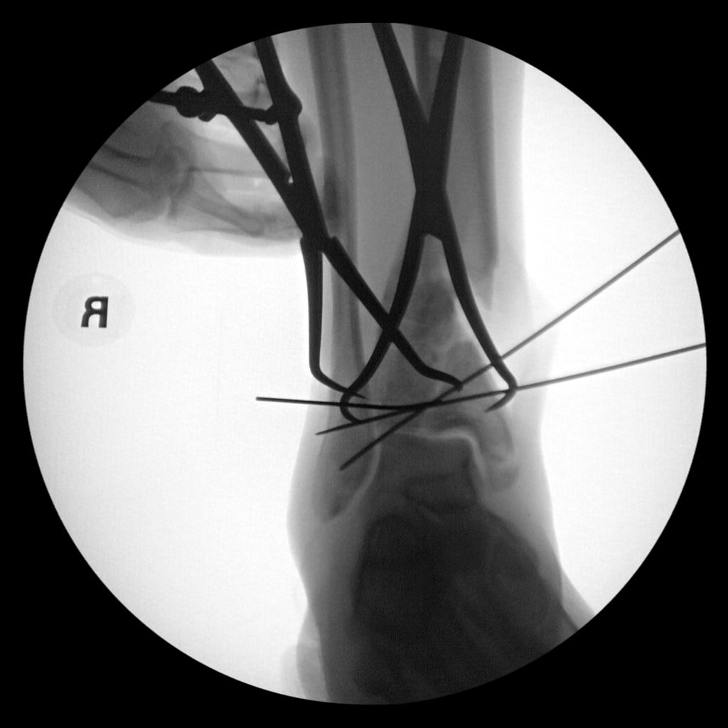
[im 4/11]
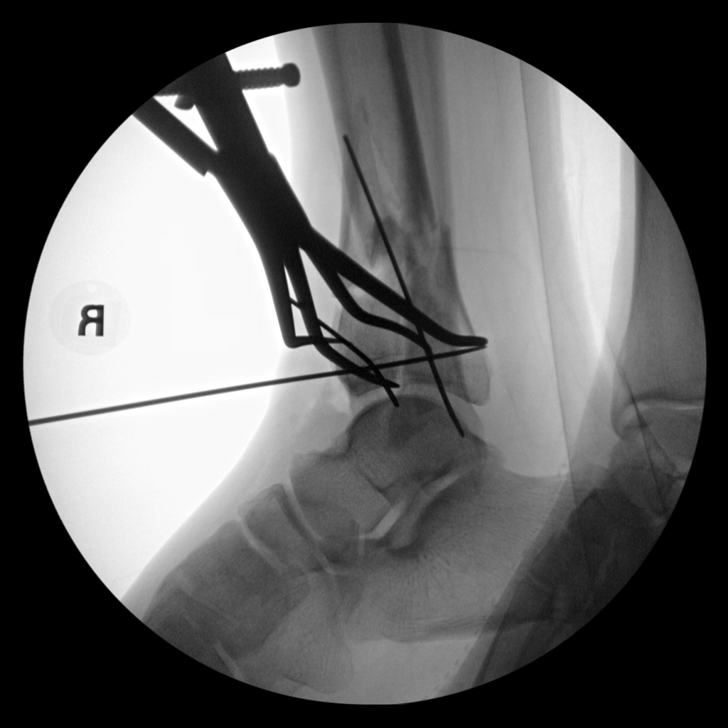
[im 5/11]
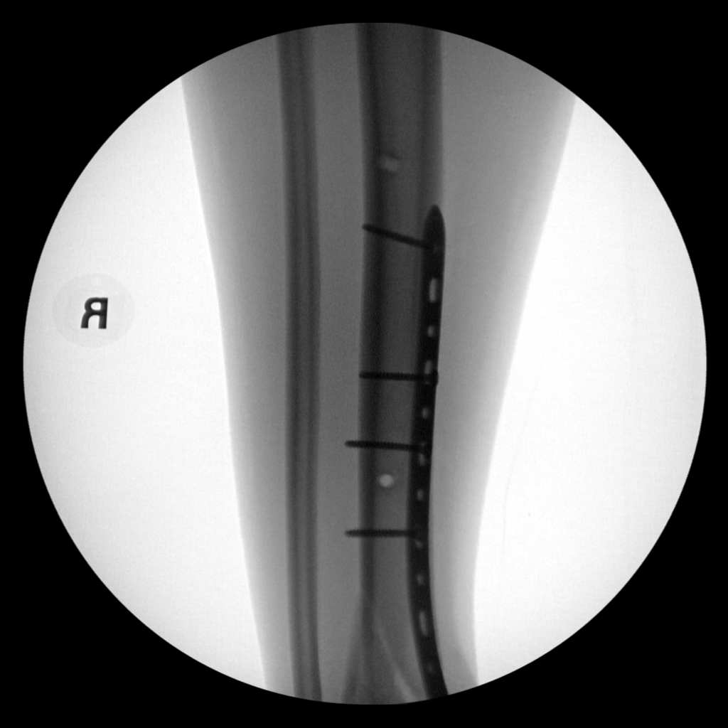
[im 6/11]
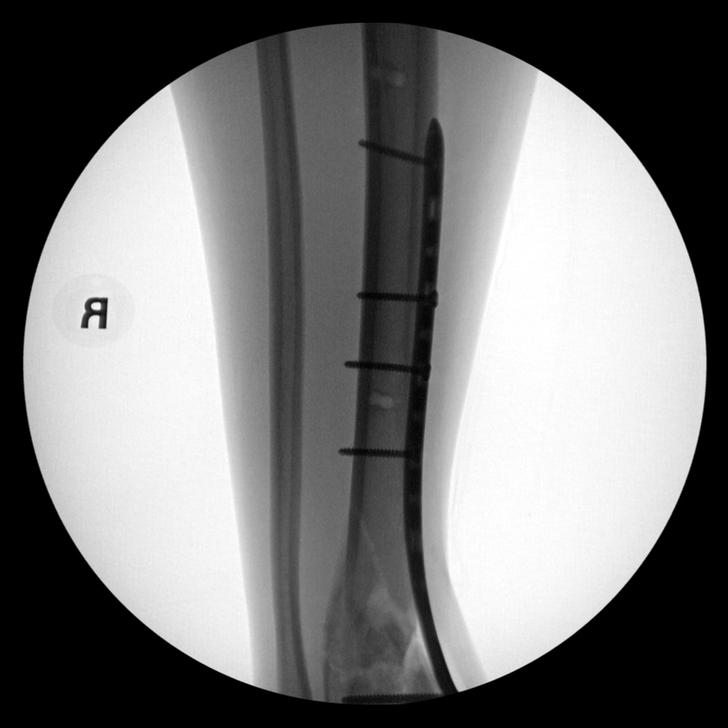
[im 7/11]
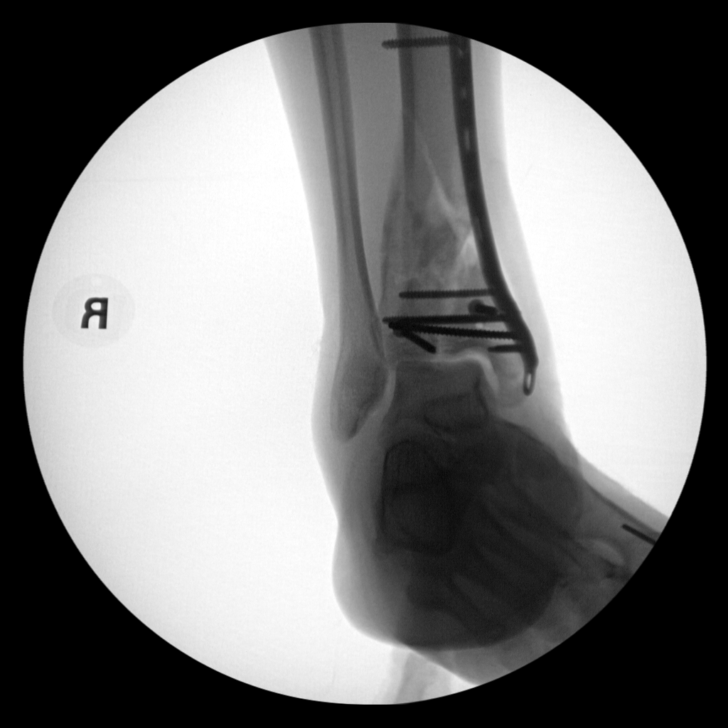
[im 8/11]
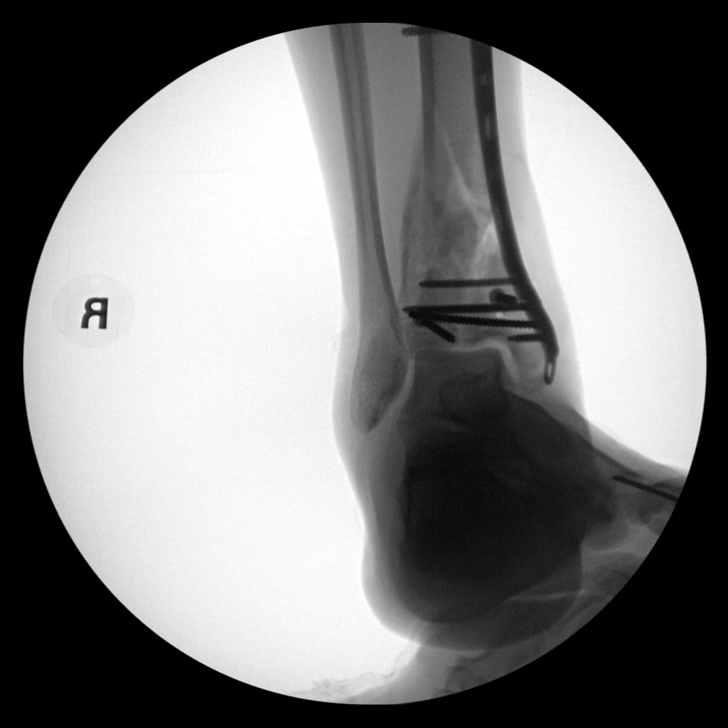
[im 9/11]
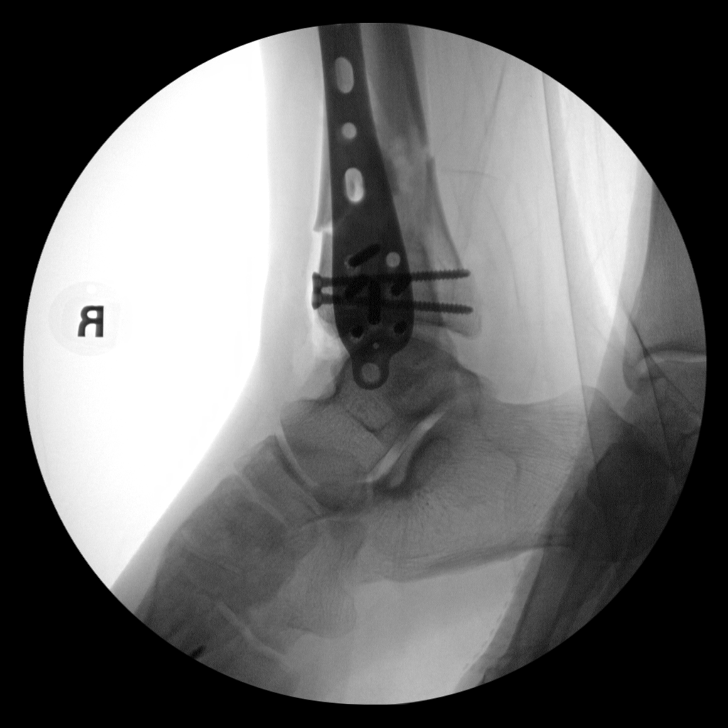
[im 10/11]
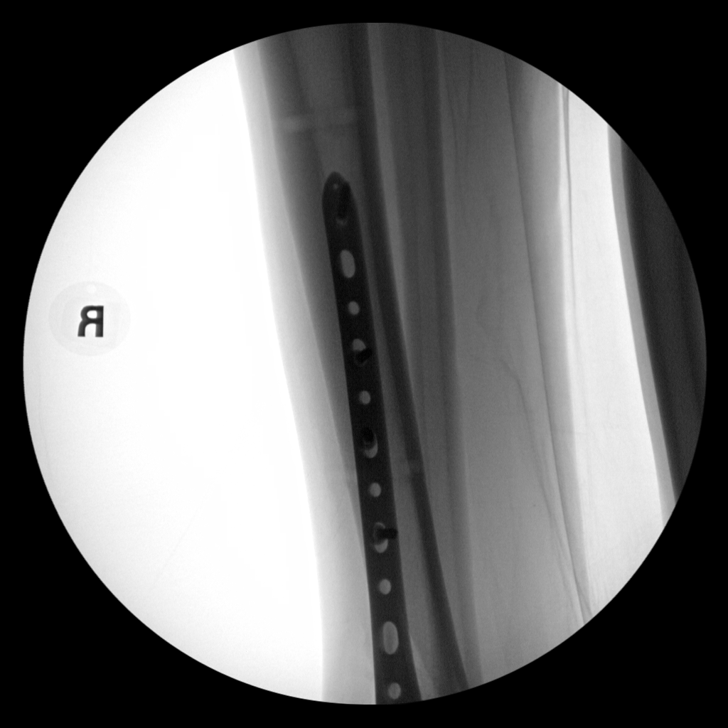
[im 11/11]
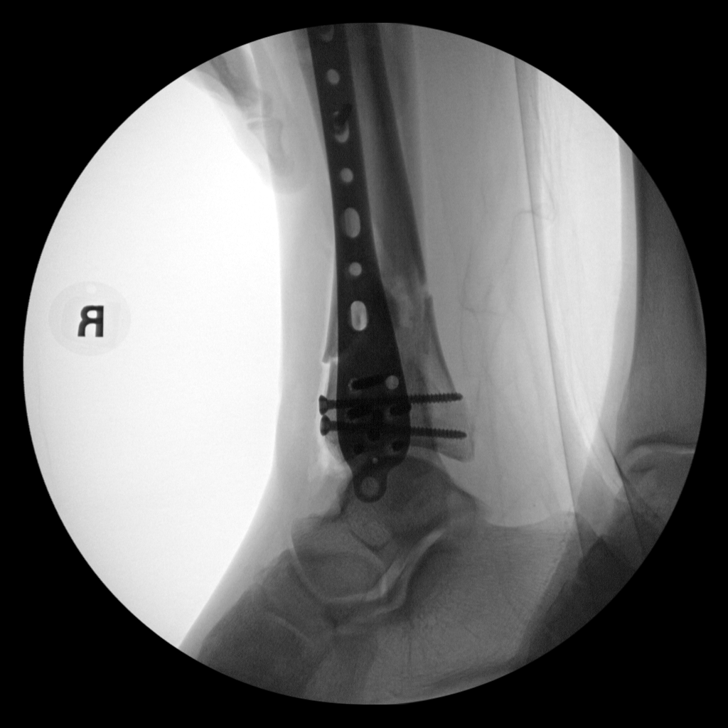

[11 of 11 positions shown; findings below may reference images not displayed]

FINDINGS: Eleven fluoroscopic images were obtained of the right ankle during
surgical internal fixation of comminuted and displaced distal right
tibial fracture. Good alignment of fracture components is noted.

Four intraoperative fluoroscopic images were obtained of the right
foot during surgical fusion of the first metatarsophalangeal joint.
Good alignment of fracture components is noted.
IMPRESSION: Status post surgical internal fixation of comminuted and displaced
distal right tibial fracture.
